# Patient Record
Sex: Female | Born: 1990 | Race: White | Hispanic: No | Marital: Single | Smoking: Current every day smoker
Health system: Southern US, Community
[De-identification: ages and names within clinical notes are randomized; demographics above are authoritative.]

## PROBLEM LIST (undated history)

## (undated) DIAGNOSIS — B009 Herpesviral infection, unspecified: Secondary | ICD-10-CM

## (undated) DIAGNOSIS — K219 Gastro-esophageal reflux disease without esophagitis: Secondary | ICD-10-CM

## (undated) DIAGNOSIS — F1121 Opioid dependence, in remission: Secondary | ICD-10-CM

## (undated) HISTORY — DX: Opioid dependence, in remission: F11.21

---
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
## 2008-02-21 ENCOUNTER — Emergency Department (HOSPITAL_COMMUNITY): Admission: EM | Admit: 2008-02-21 | Discharge: 2008-02-22 | Payer: Self-pay | Admitting: Emergency Medicine

## 2008-10-20 ENCOUNTER — Emergency Department (HOSPITAL_COMMUNITY): Admission: EM | Admit: 2008-10-20 | Discharge: 2008-10-20 | Payer: Self-pay | Admitting: Emergency Medicine

## 2009-10-19 ENCOUNTER — Emergency Department (HOSPITAL_COMMUNITY): Admission: EM | Admit: 2009-10-19 | Discharge: 2009-10-19 | Payer: Self-pay | Admitting: Emergency Medicine

## 2010-02-23 ENCOUNTER — Emergency Department (HOSPITAL_COMMUNITY): Admission: EM | Admit: 2010-02-23 | Discharge: 2010-02-23 | Payer: Self-pay | Admitting: Emergency Medicine

## 2010-07-10 ENCOUNTER — Emergency Department (HOSPITAL_COMMUNITY): Admission: EM | Admit: 2010-07-10 | Discharge: 2010-07-10 | Payer: Self-pay | Admitting: Emergency Medicine

## 2011-01-22 IMAGING — CR DG KNEE COMPLETE 4+V*R*
4 series · 4 of 4 positions shown · non-contrast
Comparison: None.

CLINICAL DATA: Knee pain

RIGHT KNEE - COMPLETE 4+ VIEW

[view not recorded (1 of 4)]
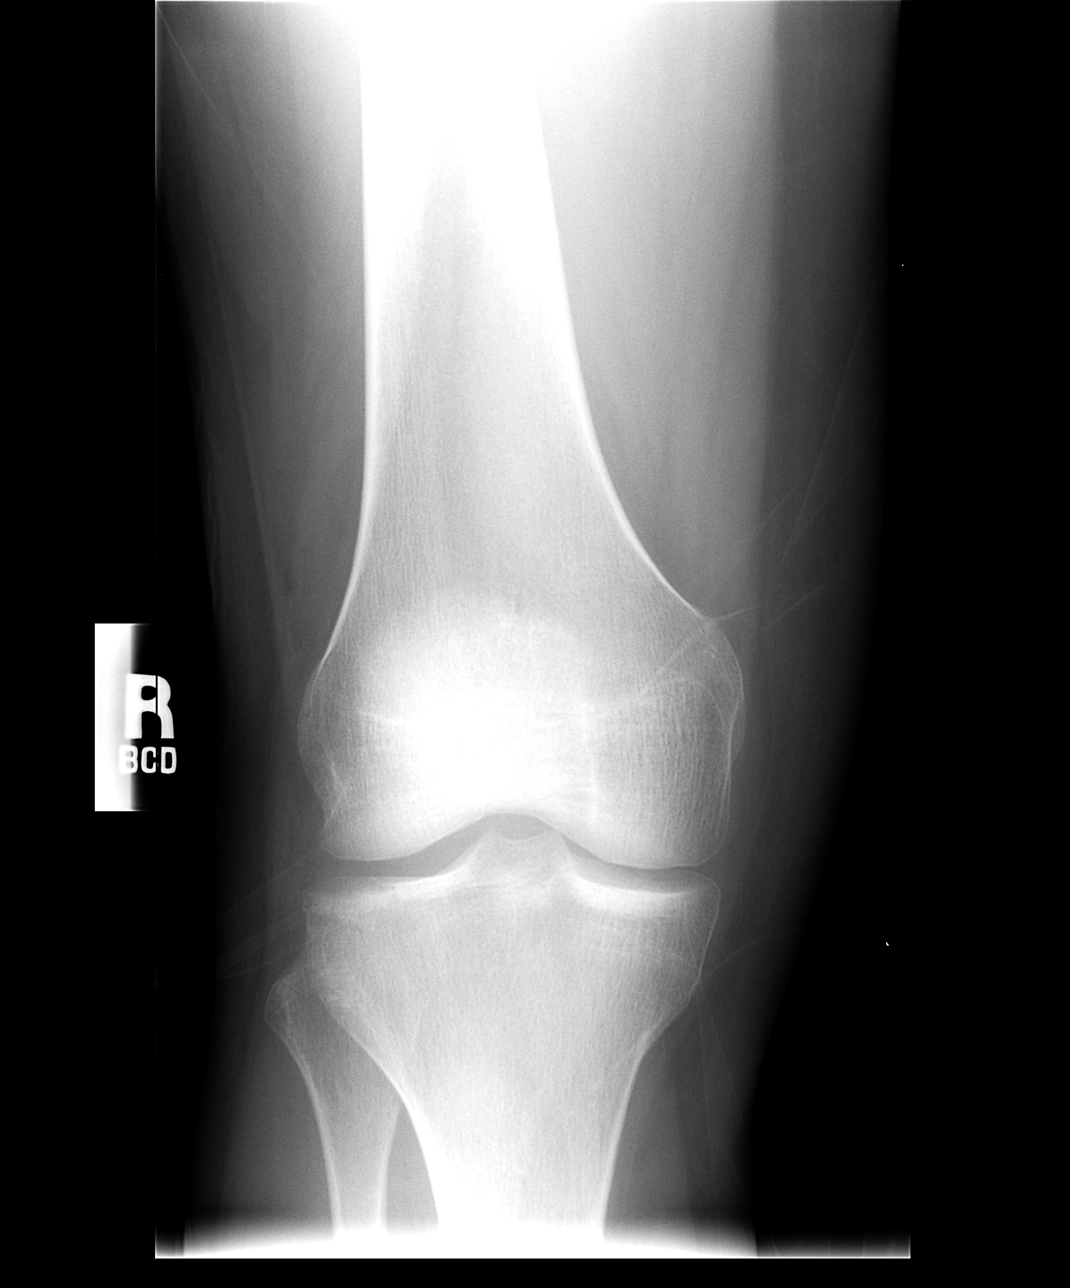

[view not recorded (2 of 4)]
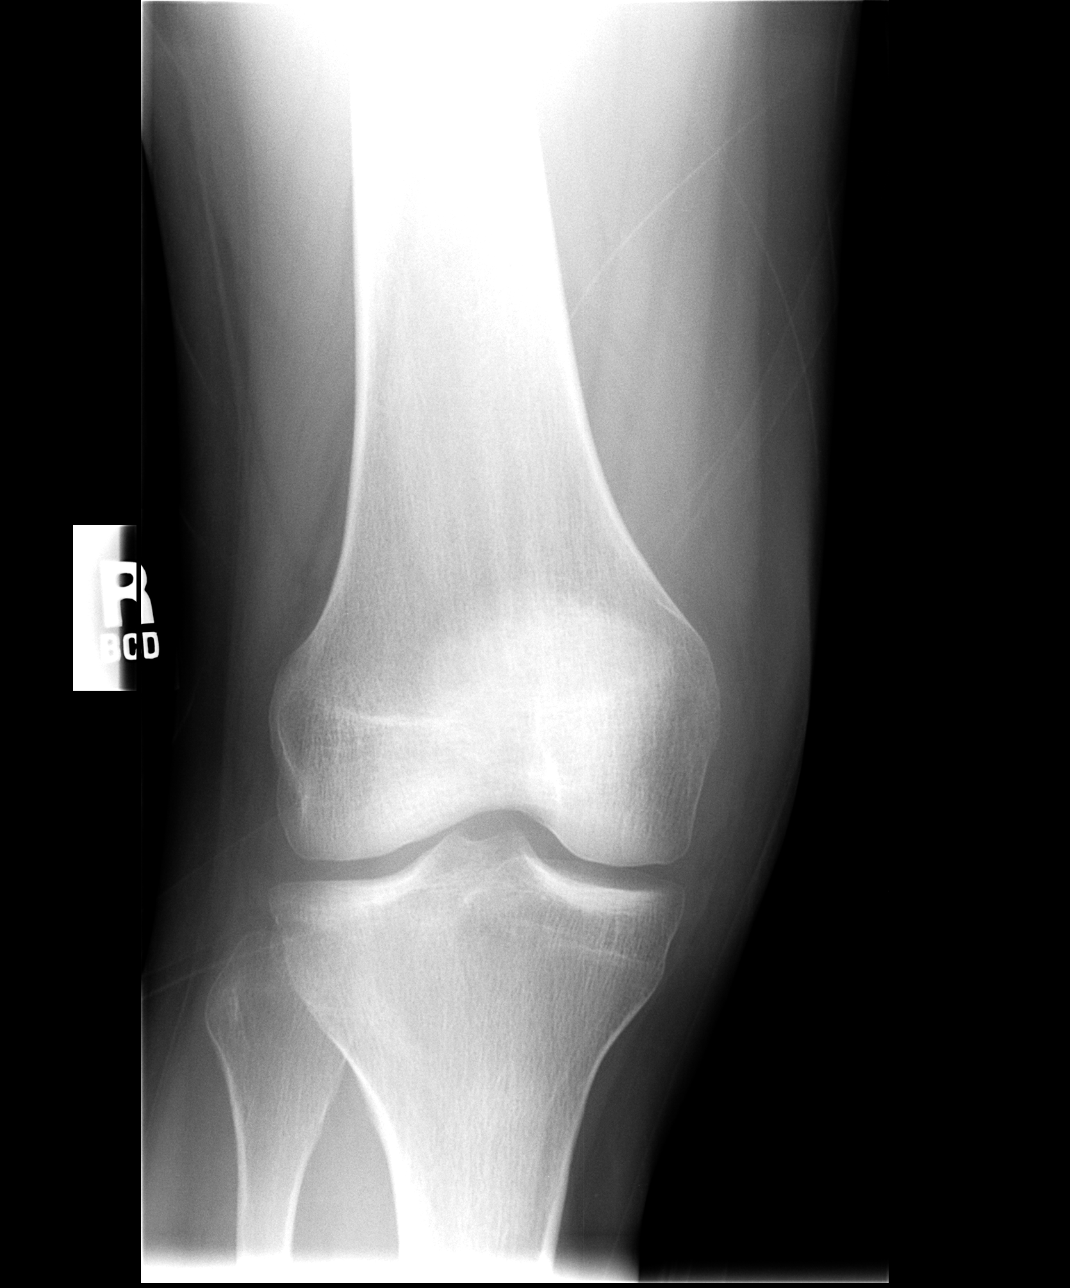

[view not recorded (3 of 4)]
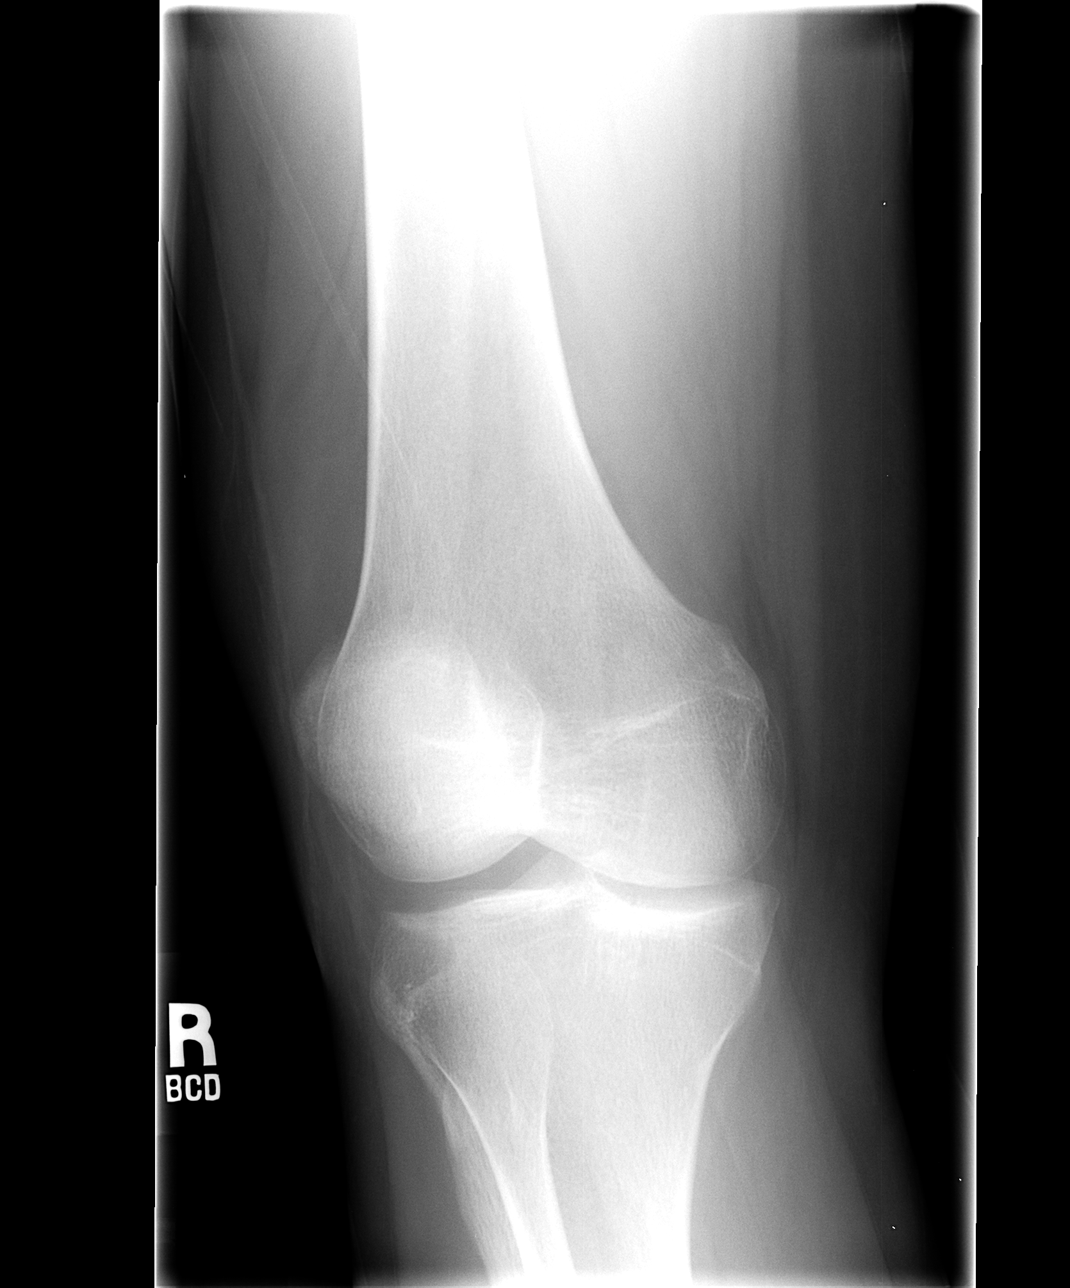

[view not recorded (4 of 4)]
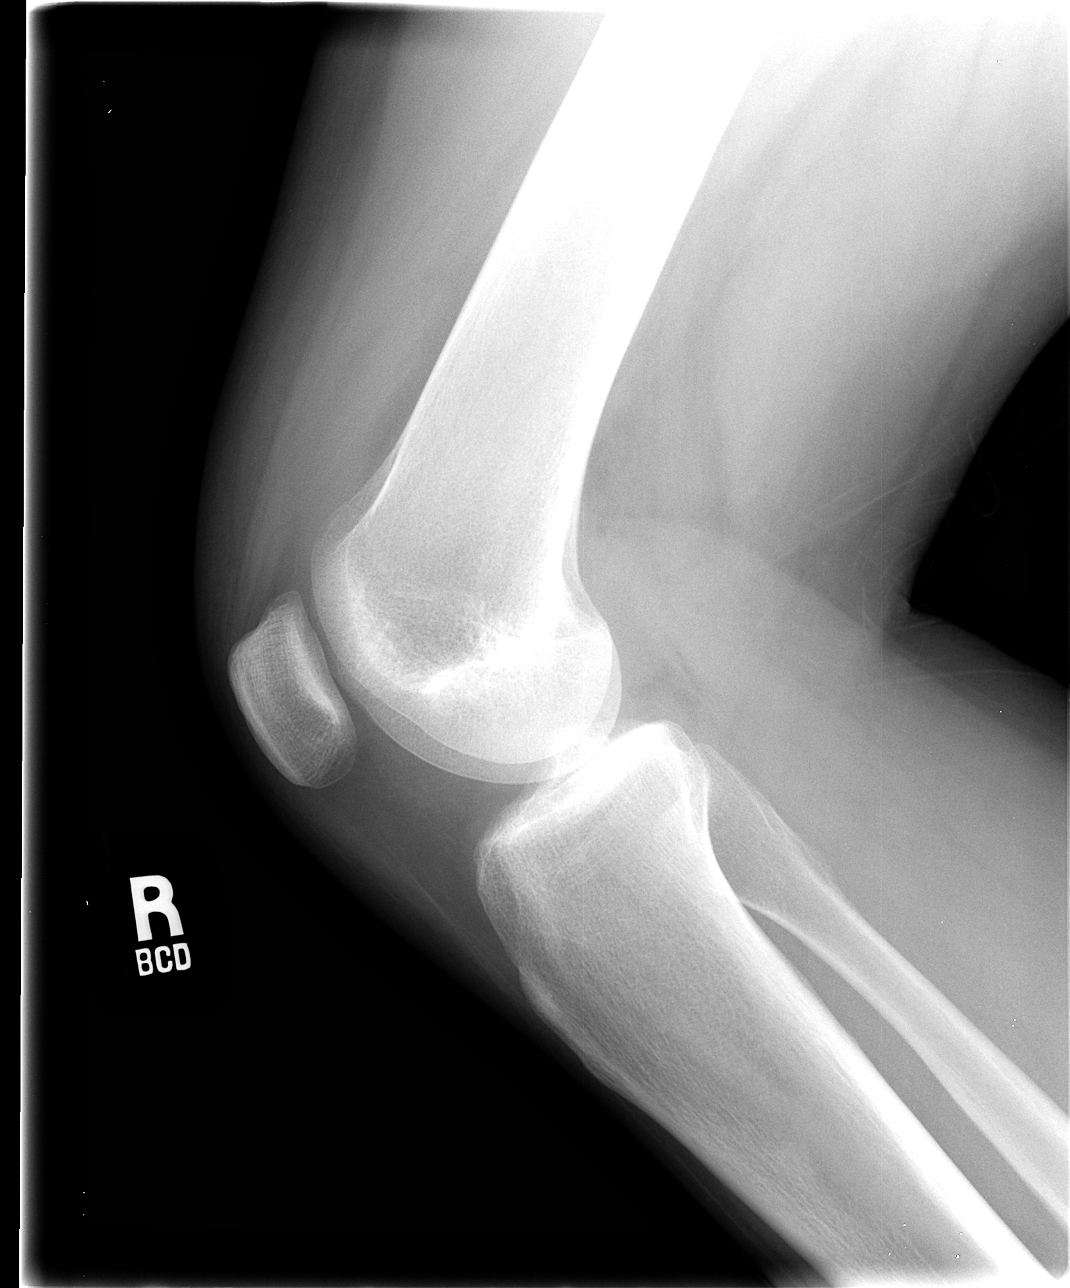

[4 of 4 positions shown; findings below may reference images not displayed]

FINDINGS: There is no evidence of fracture, dislocation, or joint
effusion.  There is no evidence of arthropathy or other focal bone
abnormality.  Soft tissues are unremarkable.
IMPRESSION: Negative.

## 2011-01-26 ENCOUNTER — Other Ambulatory Visit (HOSPITAL_COMMUNITY)
Admission: RE | Admit: 2011-01-26 | Discharge: 2011-01-26 | Disposition: A | Discharge status: Home / Self Care | Payer: Medicaid Other | Source: Ambulatory Visit | Attending: Obstetrics and Gynecology | Admitting: Obstetrics and Gynecology

## 2011-01-26 ENCOUNTER — Other Ambulatory Visit: Payer: Self-pay | Admitting: Adult Health

## 2011-01-26 DIAGNOSIS — Z01419 Encounter for gynecological examination (general) (routine) without abnormal findings: Secondary | ICD-10-CM | POA: Insufficient documentation

## 2011-01-26 DIAGNOSIS — Z113 Encounter for screening for infections with a predominantly sexual mode of transmission: Secondary | ICD-10-CM | POA: Insufficient documentation

## 2011-01-31 LAB — URINE MICROSCOPIC-ADD ON

## 2011-01-31 LAB — URINALYSIS, ROUTINE W REFLEX MICROSCOPIC
Bilirubin Urine: NEGATIVE
Glucose, UA: NEGATIVE mg/dL
Ketones, ur: NEGATIVE mg/dL
Nitrite: NEGATIVE
Urobilinogen, UA: 0.2 mg/dL (ref 0.0–1.0)

## 2011-01-31 LAB — PREGNANCY, URINE: Preg Test, Ur: NEGATIVE

## 2011-03-01 NOTE — H&P (Signed)
NAMELEVA, BAINE           ACCOUNT NO.:  [id number]   MEDICAL RECORD NO.:  [id number]          PATIENT TYPE:  INP   LOCATION:  NA                            FACILITY:  [facility]   PHYSICIAN:  Tilda Burrow, M.D. DATE OF BIRTH:  1991-05-23   DATE OF ADMISSION:  04/05/2007  DATE OF DISCHARGE:  LH                              HISTORY & PHYSICAL   ADMISSION DIAGNOSIS:  Pregnancy 39-1/[redacted] week gestation, cervical  favorability, elective induction of labor.   HISTORY OF PRESENT ILLNESS:  This 20 year old primiparous female was  admitted for elective induction of labor per patient insistence.  She  has cervical favorability at 2 cm  80%  -1, presenting part is vertex.  Cervical favorability is considered good.  She is being admitted for  induction of labor.   PAST MEDICAL HISTORY:  Benign.   PAST SURGICAL HISTORY:  Negative.   ALLERGIES:  None.   SOCIAL HISTORY:  Single, lives with mother and stepfather, works at  [employer], 12th grader in high school, recent graduate.   PRENATAL LABS:  Include blood type O+, __________ negative, rubella  immunity, present hemoglobin 12, hematocrit 35, hepatitis, HIV, RPR, GC  and chlamydia all negative.  Group B strep positive.  Hemoglobin 11,  hematocrit 36, glucose tolerance test 98 mg%.  Pecola Leisure is a female.  She is  planning circumcision.   PLAN:  Admit on Thursday for Foley bulb cervical ripening and Pitocin  induction in a.m.      Tilda Burrow, M.D.  Electronically Signed     JVF/MEDQ  D:  04/04/2007  T:  04/04/2007  Job:  [id number]   cc:   Fara Chute  Fax: [phone]

## 2011-03-01 NOTE — Group Therapy Note (Signed)
NAMEMarland Kitchen  LOMA, DUBUQUE           ACCOUNT NO.:  [id number]   MEDICAL RECORD NO.:  [id number]          PATIENT TYPE:  INP   LOCATION:  LDR3                          FACILITY:  [facility]   PHYSICIAN:  Tilda Burrow, M.D. DATE OF BIRTH:  1991-02-05   DATE OF PROCEDURE:  DATE OF DISCHARGE:                                 PROGRESS NOTE   We let Jmya rest for a little while and then she developed an urge to  push.  She pushed for not quite an hour and had a spontaneous vaginal  delivery of a viable female infant at 10:21.  The shoulders delivered very  quickly after delivery of the head.  Weight is 6 pounds 14 ounces,  Apgars are 9 and 9.  Twenty units of Pitocin diluted in 1000 cc of  lactated Ringer's was infused rapidly IV.  The placenta separated  spontaneously and delivered via controlled cord traction and maternal  pushing effort at 10:25.  Estimated blood loss 200 cc.  The vagina was  inspected and a second degree laceration was found.  It was infiltrated  with 10 cc of 1% Xylocaine and repaired with a 2-0 Vicryl.  The epidural  catheter was removed with the blue tip visualized as being intact.      Jacklyn Shell, C.N.M.      Tilda Burrow, M.D.  Electronically Signed    FC/MEDQ  D:  04/06/2007  T:  04/06/2007  Job:  [id number]

## 2011-03-01 NOTE — Op Note (Signed)
Brandy Farrell, Farrell           ACCOUNT NO.:  [id number]   MEDICAL RECORD NO.:  [id number]          PATIENT TYPE:  INP   LOCATION:  LDR3                          FACILITY:  [facility]   PHYSICIAN:  Tilda Burrow, M.D. DATE OF BIRTH:  10-Nov-1990   DATE OF PROCEDURE:  04/06/2007  DATE OF DISCHARGE:                               OPERATIVE REPORT   PROCEDURE:  Continuous epidural catheter placement at 5 a.m. on April 06, 2007.   INDICATIONS:  Patient requested at 6 cm dilated for epidural placement.  The patient had him the  Foley catheter inserted and developed labor on  her own in response to this Foley; and contracted actively through the  night requiring IV analgesics x2 and continuing to progress in labor.  Fetal heart rate tracing shows a normal pattern with good fetal heart  rate activity.  Cervix s6 cm at last nursing check.   DETAILS OF THE PROCEDURE:  The patient was placed in the sitting  position; flexed foreword with epidural placed using loss of resistance  technique.  After prepping and draping of the back, the L2-3 interspace  was attempted and bony obstruction encountered.  We moved down one  interspace and at the L3-4 interspace the epidural space was identified,  loss of resistant technique confirming proper location, and 3 mL of 1.5%  Xylocaine with epinephrine infused to dilate up the space.  Catheter was  then inserted; and there was a popping sensation followed by evidence  of intravenous location of the catheter tip.  Aspiration drew venous  blood, so a presumed intravenous position of the catheter tip was  assumed.   The catheter was removed; and we moved up one interspace, at a slightly  different angle; and were able to identify the epidural space using loss  of resistance technique.  The catheter was threaded without any fluid  distention of the epidural space; and the catheter tip was inserted 3  cm.  The catheter was taped to the back, a 7 mL bolus of 1%  Xylocaine  with epinephrine infused; and 14 mL per hour used to follow.  The  patient has good symmetric analgesic effect, and began to sleep through  the contractions.   ADDENDUM:  Shortly after our epidural was quite effective, at  approximately 5:30 a.m., the Foley catheter was inserted and 400 mL of  urine drained.  Shortly thereafter, the patient had a significant  bradycardia down into the 60s lasting greater than 60 seconds, with slow  recovery.  The exam showed the cervix to be 9 cm but at a -1 station.  Position changes and oxygen were administered with fluid bolus.  The  patient's blood pressure was acceptable in approximately the 100 to 110  diastolic range.  No ephedrine was necessary; and she had spontaneous  recovery of on the heart rate into the normal range with total  bradycardia time. between three and four minutes. Beat-to-beat  variability was present.  There was no suspicion of late decelerations.  It was assumed that the bradycardia was associated with vagal response  to the changes in head pressure  with the bladder having been emptied.   The patient is completely comfortable; and she is now at 9 cm 90%  effaced, -1 station.  The ischial spines are quite prominent.  It is yet  to be determined, the ability of the head to proceed past the spine as  the vertex is in a right occiput anterior position.  Will monitor for  progress.  Contractions are spaced out to every 8 minutes; will require  Pitocin augmentation to be initiated.      Tilda Burrow, M.D.  Electronically Signed     JVF/MEDQ  D:  04/06/2007  T:  04/06/2007  Job:  [id number]

## 2011-03-04 NOTE — H&P (Signed)
NAMERENE, GONSOULIN           ACCOUNT NO.:  [id number]   MEDICAL RECORD NO.:  [id number]          PATIENT TYPE:  OIB   LOCATION:  A415                          FACILITY:  [facility]   PHYSICIAN:  Tilda Burrow, M.D. DATE OF BIRTH:  09-11-1991   DATE OF ADMISSION:  11/08/2006  DATE OF DISCHARGE:  LH                              HISTORY & PHYSICAL   REASON FOR ADMISSION:  Pregnancy at 20 weeks with urinary frequency,  urinary discomfort, mild abdominal cramping, and hematuria.   HISTORY OF PRESENT ILLNESS:  Brandy Farrell presents to the hospital with these  complaints.  Medical history unknown at this point due to the fact that  prenatal records are unavailable.  Electronic fetal monitoring was  applied to the patient.  There were no uterine contractions noted.  Fetal heart rate is strong and regular.  She had been seen in the  diagnosis and was diagnosed with a UTI but apparently was negligent in  taking her medication.   PLAN:  We are going to give her 1 gm of Rocephin IM, discharge her 30-40  minutes after that, with a prescription for Macrobid b.i.d. for 7 days  and Pyridium 200 mg t.i.d. for 3 days.  She is to let us know if there  are any further problems.      Brandy Farrell, Brandy Farrell      Tilda Burrow, M.D.  Electronically Signed    DL/MEDQ  D:  16/07/9603  T:  11/08/2006  Job:  [id number]

## 2011-06-05 ENCOUNTER — Encounter: Payer: Self-pay | Admitting: *Deleted

## 2011-06-05 ENCOUNTER — Emergency Department (HOSPITAL_COMMUNITY)
Admission: EM | Admit: 2011-06-05 | Discharge: 2011-06-05 | Disposition: A | Discharge status: Home / Self Care | Payer: Self-pay | Attending: Emergency Medicine | Admitting: Emergency Medicine

## 2011-06-05 DIAGNOSIS — R071 Chest pain on breathing: Secondary | ICD-10-CM | POA: Insufficient documentation

## 2011-06-05 DIAGNOSIS — R0789 Other chest pain: Secondary | ICD-10-CM

## 2011-06-05 DIAGNOSIS — F17200 Nicotine dependence, unspecified, uncomplicated: Secondary | ICD-10-CM | POA: Insufficient documentation

## 2011-06-05 LAB — URINALYSIS, ROUTINE W REFLEX MICROSCOPIC
Bilirubin Urine: NEGATIVE
Nitrite: NEGATIVE
Protein, ur: NEGATIVE mg/dL

## 2011-06-05 LAB — CBC
HCT: 36.6 % (ref 36.0–46.0)
Hemoglobin: 12.6 g/dL (ref 12.0–15.0)
MCH: 32.5 pg (ref 26.0–34.0)
Platelets: 325 10*3/uL (ref 150–400)
RBC: 3.88 MIL/uL (ref 3.87–5.11)

## 2011-06-05 LAB — DIFFERENTIAL
Basophils Absolute: 0.1 10*3/uL (ref 0.0–0.1)
Eosinophils Relative: 1 % (ref 0–5)
Lymphs Abs: 1.5 10*3/uL (ref 0.7–4.0)
Monocytes Absolute: 1.6 10*3/uL — ABNORMAL HIGH (ref 0.1–1.0)
Monocytes Relative: 8 % (ref 3–12)
Neutro Abs: 16.1 10*3/uL — ABNORMAL HIGH (ref 1.7–7.7)
Neutrophils Relative %: 84 % — ABNORMAL HIGH (ref 43–77)

## 2011-06-05 LAB — BASIC METABOLIC PANEL
Calcium: 9.3 mg/dL (ref 8.4–10.5)
Chloride: 98 mEq/L (ref 96–112)
GFR calc Af Amer: >60 mL/min (ref >60)
GFR calc non Af Amer: >60 mL/min (ref >60)

## 2011-06-05 LAB — URINE MICROSCOPIC-ADD ON

## 2011-06-05 LAB — PREGNANCY, URINE: Preg Test, Ur: NEGATIVE

## 2011-06-05 MED ORDER — IBUPROFEN 600 MG PO TABS: 600.0000 mg | ORAL_TABLET | Freq: Four times a day (QID) | ORAL | Status: AC | PRN

## 2011-06-05 MED ORDER — SODIUM CHLORIDE 0.9 % IV SOLN: Freq: Once | INTRAVENOUS | Status: DC

## 2011-06-05 MED ORDER — HYDROCODONE-ACETAMINOPHEN 5-325 MG PO TABS
1.0000 | ORAL_TABLET | Freq: Once | ORAL | Status: AC
  Administered 2011-06-05: 1 via ORAL
  Filled 2011-06-05: qty 1

## 2011-06-05 MED ORDER — HYDROCODONE-ACETAMINOPHEN 5-325 MG PO TABS: 1.0000 | ORAL_TABLET | ORAL | Status: AC | PRN

## 2011-06-05 MED ORDER — IBUPROFEN 800 MG PO TABS
800.0000 mg | ORAL_TABLET | Freq: Once | ORAL | Status: AC
  Administered 2011-06-05: 800 mg via ORAL
  Filled 2011-06-05: qty 1

## 2011-06-05 NOTE — ED Provider Notes (Signed)
History     CSN: [id number] Arrival date & time: 06/05/2011  1:58 AM  Chief Complaint  Patient presents with  . Abdominal Pain    right upper quad   Patient is a 20 y.o. female presenting with chest pain. The history is provided by the patient.  Chest Pain The chest pain began yesterday. Chest pain occurs constantly. The chest pain is worsening. The pain is associated with breathing, coughing and lifting. At its most intense, the pain is at 7/10. The pain is currently at 5/10. The severity of the pain is moderate. The quality of the pain is described as aching. The pain does not radiate. Chest pain is worsened by certain positions and deep breathing. Pertinent negatives for primary symptoms include no fever, no cough, no wheezing, no nausea and no vomiting.     History reviewed. No pertinent past medical history.  History reviewed. No pertinent past surgical history.  History reviewed. No pertinent family history.  History  Substance Use Topics  . Smoking status: Current Some Day Smoker  . Smokeless tobacco: Not on file  . Alcohol Use: Yes     occasionally    OB History    Grav Para Term Preterm Abortions TAB SAB Ect Mult Living                  Review of Systems  Constitutional: Negative for fever.  Respiratory: Negative for cough and wheezing.   Cardiovascular: Positive for chest pain.  Gastrointestinal: Negative for nausea and vomiting.    Physical Exam  BP 124/59  Pulse 100  Temp 99 F (37.2 C)  Resp 20  Ht 5\' 4"  (1.626 m)  Wt 170 lb (77.111 kg)  BMI 29.18 kg/m2  SpO2 100%  Physical Exam  ED Course  Procedures  MDM Patient with pain to right chest wall and no recall of any strenuous activity. Improved with ibuprofen and hydrocodone.  MDM Reviewed: nursing note and vitals         Nicoletta Dress. Colon Branch, MD 06/05/11 [phone]

## 2011-06-05 NOTE — ED Notes (Signed)
Pt c/o right upper quad pain x 1 day; pt denies any n/v/d

## 2011-06-05 NOTE — ED Notes (Signed)
Pt self ambulated out with a steady gait stating no needs 

## 2011-07-07 LAB — DIFFERENTIAL
Basophils Absolute: 0
Basophils Relative: 0
Monocytes Relative: 8
Neutrophils Relative %: 86 — ABNORMAL HIGH

## 2011-07-07 LAB — CBC
HCT: 35.8 — ABNORMAL LOW
Hemoglobin: 12.2
MCHC: 34.1
MCV: 90.5
RBC: 3.96
RDW: 12.4

## 2011-07-07 LAB — URINE MICROSCOPIC-ADD ON

## 2011-07-07 LAB — URINALYSIS, ROUTINE W REFLEX MICROSCOPIC
Glucose, UA: NEGATIVE
Protein, ur: 30 — AB
Specific Gravity, Urine: 1.01
Urobilinogen, UA: 1
pH: 6.5

## 2011-07-29 LAB — STREP A DNA PROBE: Group A Strep Probe: NEGATIVE

## 2011-08-03 LAB — HEMOGLOBIN AND HEMATOCRIT, BLOOD
HCT: 29.2 — ABNORMAL LOW
Hemoglobin: 10.3 — ABNORMAL LOW

## 2011-08-03 LAB — CORD BLOOD GAS (ARTERIAL)
Bicarbonate: 23.7
pCO2 cord blood (arterial): 47.5
pH cord blood (arterial): 7.31
pO2 cord blood: 23.5

## 2011-08-03 LAB — DIFFERENTIAL
Basophils Absolute: 0.1
Lymphs Abs: 1.7
Monocytes Absolute: 0.8
Monocytes Relative: 7

## 2011-08-03 LAB — CBC
HCT: 33.8 — ABNORMAL LOW
Hemoglobin: 12
MCHC: 35.4
RBC: 3.59 — ABNORMAL LOW
WBC: 10.6 — ABNORMAL HIGH

## 2011-08-03 LAB — ABO/RH: ABO/RH(D): O POS

## 2012-09-18 ENCOUNTER — Other Ambulatory Visit: Payer: Self-pay | Admitting: Obstetrics & Gynecology

## 2013-12-11 ENCOUNTER — Ambulatory Visit: Payer: Self-pay | Admitting: Advanced Practice Midwife

## 2014-07-24 ENCOUNTER — Other Ambulatory Visit: Payer: Self-pay | Admitting: Advanced Practice Midwife

## 2014-07-28 ENCOUNTER — Other Ambulatory Visit: Payer: Self-pay | Admitting: Women's Health

## 2014-07-29 ENCOUNTER — Encounter: Payer: Self-pay | Admitting: Women's Health

## 2014-07-29 ENCOUNTER — Ambulatory Visit (INDEPENDENT_AMBULATORY_CARE_PROVIDER_SITE_OTHER): Payer: Medicaid Other | Admitting: Women's Health

## 2014-07-29 ENCOUNTER — Other Ambulatory Visit (HOSPITAL_COMMUNITY)
Admission: RE | Admit: 2014-07-29 | Discharge: 2014-07-29 | Disposition: A | Discharge status: Home / Self Care | Payer: Medicaid Other | Source: Ambulatory Visit | Attending: Obstetrics & Gynecology | Admitting: Obstetrics & Gynecology

## 2014-07-29 VITALS — BP 128/88 | Ht 65.5 in | Wt 190.0 lb

## 2014-07-29 DIAGNOSIS — Z308 Encounter for other contraceptive management: Secondary | ICD-10-CM

## 2014-07-29 DIAGNOSIS — Z113 Encounter for screening for infections with a predominantly sexual mode of transmission: Secondary | ICD-10-CM | POA: Diagnosis present

## 2014-07-29 DIAGNOSIS — Z01419 Encounter for gynecological examination (general) (routine) without abnormal findings: Secondary | ICD-10-CM | POA: Diagnosis not present

## 2014-07-29 DIAGNOSIS — Z3040 Encounter for surveillance of contraceptives, unspecified: Secondary | ICD-10-CM

## 2014-07-29 MED ORDER — MISOPROSTOL 200 MCG PO TABS: 400.0000 ug | ORAL_TABLET | Freq: Once | ORAL | Status: DC

## 2014-07-29 NOTE — Patient Instructions (Signed)
Take cytotec 2-3 hours before you come to have your IUD removed

## 2014-07-29 NOTE — Progress Notes (Signed)
Patient ID: Brandy Farrell, female   DOB: 06/22/1991, 23 y.o.   MRN: [id number] Subjective:   Brandy Farrell is a 23 y.o. [id number] African American female here for a routine FP Mcaid well-woman exam.  No LMP recorded. Patient is not currently having periods (Reason: IUD).   Mirena past due to come out, should have been removed Nov 2014. IUD strings not visible when last checked, had pelvic u/s that confirmed still intrauterine- thinks this may have been in 2012.  Last sex last Thursday, no condoms.  Current complaints: none PCP: Dr. Andrey FarmerSassar, Brandy Farrell       Does desire STI labs  Social History: Sexual: heterosexual Marital Status: dating Living situation: alone w/ child Occupation: Production designer, theatre/television/filmManager at [employer] Tobacco/alcohol: tobacco & etoh occasionally  Illicit drugs: no history of illicit drug use  The following portions of the patient's history were reviewed and updated as appropriate: allergies, current medications, past family history, past medical history, past social history, past surgical history and problem list.  Past Medical History History reviewed. No pertinent past medical history.  Past Surgical History History reviewed. No pertinent past surgical history.  Gynecologic History No obstetric history on file.  No LMP recorded. Patient is not currently having periods (Reason: IUD). Contraception: IUD Last Pap: not sure. Results were: unsure, has had some abnormal paps in past Last mammogram: never. Results were: n/a Last TCS: never  Obstetric History OB History  No data available    Current Medications No current outpatient prescriptions on file prior to visit.   No current facility-administered medications on file prior to visit.    Review of Systems Patient denies any headaches, blurred vision, shortness of breath, chest pain, abdominal pain, problems with bowel movements, urination, or intercourse.  Objective:  BP 128/88  Ht 5' 5.5" (1.664 m)  Wt 190 lb (86.183  kg)  BMI 31.13 kg/m2 Physical Exam  General:  Well developed, well nourished, no acute distress. She is alert and oriented x3. Skin:  Warm and dry Neck:  Midline trachea, no thyromegaly or nodules Cardiovascular: Regular rate and rhythm, no murmur heard Lungs:  Effort normal, all lung fields clear to auscultation bilaterally Breasts:  No dominant palpable mass, retraction, or nipple discharge Abdomen:  Soft, non tender, no hepatosplenomegaly or masses Pelvic:  External genitalia is normal in appearance.  The vagina is normal in appearance. The cervix is bulbous, no CMT.  IUD strings not visible. Thin prep pap is done w/ reflex HR HPV cotesting. Uterus is felt to be normal size, shape, and contour.  No adnexal masses or tenderness noted. Extremities:  No swelling or varicosities noted Psych:  She has a normal mood and affect UPT today: neg  Assessment:   Healthy well-woman exam STI screening  IUD past due to be removed  Plan:  Rx cytotec 200mcg to take 2-3hrs before coming to have IUD removed F/U 1wk for IUD removal and reinsertion, or sooner if needed Abstinence until after new IUD placed Mammogram @25yo  or sooner if problems Colonoscopy @25yo  or sooner if problems  Marge DuncansBooker, Kimberly Randall CNM, [facility] 07/29/2014 12:41 PM

## 2014-07-30 ENCOUNTER — Telehealth: Payer: Self-pay | Admitting: Women's Health

## 2014-07-30 ENCOUNTER — Encounter: Payer: Self-pay | Admitting: Women's Health

## 2014-07-30 DIAGNOSIS — R768 Other specified abnormal immunological findings in serum: Secondary | ICD-10-CM | POA: Insufficient documentation

## 2014-07-30 LAB — HIV ANTIBODY (ROUTINE TESTING W REFLEX): HIV 1&2 Ab, 4th Generation: NONREACTIVE

## 2014-07-30 LAB — HSV 2 ANTIBODY, IGG: HSV 2 GLYCOPROTEIN G AB, IGG: 9.4 IV — AB

## 2014-07-30 LAB — RPR

## 2014-07-30 LAB — CYTOLOGY - PAP

## 2014-07-30 NOTE — Telephone Encounter (Signed)
Attempted to call pt to notify of +HSV2, left message to return call.  Cheral MarkerKimberly R. Latravia Southgate, CNM, WHNP-BC 07/30/2014 2:12 PM

## 2014-07-30 NOTE — Telephone Encounter (Signed)
Pt returned call, notified her of +HSV2, answered questions.  Cheral MarkerKimberly R. Adely Facer, CNM, [facility] 07/30/2014 2:57 PM

## 2014-08-05 ENCOUNTER — Ambulatory Visit: Payer: Medicaid Other | Admitting: Women's Health

## 2014-08-06 ENCOUNTER — Ambulatory Visit (INDEPENDENT_AMBULATORY_CARE_PROVIDER_SITE_OTHER): Payer: Medicaid Other | Admitting: Women's Health

## 2014-08-06 ENCOUNTER — Encounter: Payer: Self-pay | Admitting: Women's Health

## 2014-08-06 VITALS — BP 132/58 | Ht 65.0 in | Wt 193.0 lb

## 2014-08-06 DIAGNOSIS — Z3202 Encounter for pregnancy test, result negative: Secondary | ICD-10-CM

## 2014-08-06 DIAGNOSIS — Z975 Presence of (intrauterine) contraceptive device: Secondary | ICD-10-CM

## 2014-08-06 DIAGNOSIS — Z538 Procedure and treatment not carried out for other reasons: Secondary | ICD-10-CM

## 2014-08-06 LAB — POCT URINE PREGNANCY: PREG TEST UR: NEGATIVE

## 2014-08-06 MED ORDER — DOXYCYCLINE HYCLATE 100 MG PO CAPS: 100.0000 mg | ORAL_CAPSULE | Freq: Two times a day (BID) | ORAL | Status: DC

## 2014-08-06 NOTE — Progress Notes (Signed)
Christain Seward GraterM Brumley is a 23 y.o. year old [id number] African American female who presents for removal of a Mirena IUD. Her Mirena IUD was placed Nov 2009 and she would like another. She forgot to take cytotec as directed.   No LMP recorded. Patient is not currently having periods (Reason: IUD). BP 132/58  Ht 5\' 5"  (1.651 m)  Wt 193 lb (87.544 kg)  BMI 32.12 kg/m2  Time out was performed.  A graves speculum was placed in the vagina.  The cervix was visualized, and the strings were not visible. Transvaginal u/s performed and IUD confirmed w/in mid uterus by JAG. Attempted removal w/ cyto brush and curved hemostats w/o success. Cervix dilated up to 22, and still unable to grasp w/ curved hemostats. Dr. Emelda FearFerguson in to assist, crochet hook, and IUD remover also tried w/o success. Decision made by Dr. Emelda FearFerguson to have her return one day next week in am for laminaria placement, then pm for paracervical block and removal attempt. Will treat w/ Doxycycline 100mg  BID x 1d d/t intrauterine instrumentation. No sex until after IUD removed and new one placed.   Marge DuncansBooker, Aariv Medlock Randall CNM, [facility]NP[facility] 08/06/2014 5:26 PM

## 2014-08-13 ENCOUNTER — Ambulatory Visit (INDEPENDENT_AMBULATORY_CARE_PROVIDER_SITE_OTHER): Payer: Medicaid Other | Admitting: Women's Health

## 2014-08-13 ENCOUNTER — Encounter: Payer: Self-pay | Admitting: Obstetrics and Gynecology

## 2014-08-13 ENCOUNTER — Encounter: Payer: Self-pay | Admitting: Women's Health

## 2014-08-13 ENCOUNTER — Ambulatory Visit (INDEPENDENT_AMBULATORY_CARE_PROVIDER_SITE_OTHER): Payer: Medicaid Other | Admitting: Obstetrics and Gynecology

## 2014-08-13 VITALS — BP 108/60 | Ht 65.0 in | Wt 193.0 lb

## 2014-08-13 VITALS — BP 110/70 | Ht 65.0 in | Wt 193.0 lb

## 2014-08-13 DIAGNOSIS — Z30433 Encounter for removal and reinsertion of intrauterine contraceptive device: Secondary | ICD-10-CM

## 2014-08-13 DIAGNOSIS — Z308 Encounter for other contraceptive management: Secondary | ICD-10-CM

## 2014-08-13 MED ORDER — DOXYCYCLINE HYCLATE 100 MG PO CAPS: 100.0000 mg | ORAL_CAPSULE | Freq: Two times a day (BID) | ORAL | Status: DC

## 2014-08-13 NOTE — Progress Notes (Signed)
Patient ID: Brandy Farrell, female   DOB: 11/30/1990, 23 y.o.   MRN: [id number]   [facility]  Patient name: Brandy Farrell MRN [id number]  Date of birth: 02/07/1991  CC & HPI:  Brandy Farrell is a 23 y.o. African American female presenting today for laminaria placement in preparation for IUD removal this afternoon.   Pertinent History Reviewed:  Medical & Surgical Hx:   History reviewed. No pertinent past medical history. History reviewed. No pertinent past surgical history. Medications: Reviewed & Updated - see associated section Social History: Reviewed -  reports that she has been smoking.  She has never used smokeless tobacco.  Objective Findings:  Vitals: BP 108/60  Ht 5\' 5"  (1.651 m)  Wt 193 lb (87.544 kg)  BMI 32.12 kg/m2  Physical Examination: General appearance - alert, well appearing, and in no distress Pelvic - normal external genitalia, vulva, vagina, cervix, uterus and adnexa Laminaria placed w/o difficulty w/ assistance from JVF, 2 damp 4x4's inserted into vagina  No results found for this or any previous visit (from the past 24 hour(s)).   Assessment & Plan:  A:   Laminaria placement in preparation for IUD removal this afternoon P:   F/U @4pm  as scheduled for laminaria removal and IUD removal and possible reinsertion   Marge DuncansBooker, Brandy Farrell CNM, WHNP-BC 08/13/2014 9:29 AM

## 2014-08-13 NOTE — Patient Instructions (Addendum)
 Nothing in vagina for 3 days (no sex, douching, tampons, etc...)  Check your strings once a month to make sure you can feel them, if you are not able to please let us know  If you develop a fever of 100.4 or more in the next few weeks, or if you develop severe abdominal pain, please let us know  Use a backup method of birth control, such as condoms, for 2 weeks    Levonorgestrel intrauterine device (IUD)- Liletta What is this medicine? LEVONORGESTREL IUD (LEE voe nor jes trel) is a contraceptive (birth control) device. The device is placed inside the uterus by a healthcare professional. It is used to prevent pregnancy and can also be used to treat heavy bleeding that occurs during your period. Depending on the device, it can be used for 3 to 5 years. This medicine may be used for other purposes; ask your health care provider or pharmacist if you have questions. COMMON BRAND NAME(S): LILETTA, Mirena, Skyla What should I tell my health care provider before I take this medicine? They need to know if you have any of these conditions: -abnormal Pap smear -cancer of the breast, uterus, or cervix -diabetes -endometritis -genital or pelvic infection now or in the past -have more than one sexual partner or your partner has more than one partner -heart disease -history of an ectopic or tubal pregnancy -immune system problems -IUD in place -liver disease or tumor -problems with blood clots or take blood-thinners -use intravenous drugs -uterus of unusual shape -vaginal bleeding that has not been explained -an unusual or allergic reaction to levonorgestrel, other hormones, silicone, or polyethylene, medicines, foods, dyes, or preservatives -pregnant or trying to get pregnant -breast-feeding How should I use this medicine? This device is placed inside the uterus by a health care professional. Talk to your pediatrician regarding the use of this medicine in children. Special care may be  needed. Overdosage: If you think you have taken too much of this medicine contact a poison control center or emergency room at once. NOTE: This medicine is only for you. Do not share this medicine with others. What if I miss a dose? This does not apply. What may interact with this medicine? Do not take this medicine with any of the following medications: -amprenavir -bosentan -fosamprenavir This medicine may also interact with the following medications: -aprepitant -barbiturate medicines for inducing sleep or treating seizures -bexarotene -griseofulvin -medicines to treat seizures like carbamazepine, ethotoin, felbamate, oxcarbazepine, phenytoin, topiramate -modafinil -pioglitazone -rifabutin -rifampin -rifapentine -some medicines to treat HIV infection like atazanavir, indinavir, lopinavir, nelfinavir, tipranavir, ritonavir -St. Rasheen Schewe's wort -warfarin This list may not describe all possible interactions. Give your health care provider a list of all the medicines, herbs, non-prescription drugs, or dietary supplements you use. Also tell them if you smoke, drink alcohol, or use illegal drugs. Some items may interact with your medicine. What should I watch for while using this medicine? Visit your doctor or health care professional for regular check ups. See your doctor if you or your partner has sexual contact with others, becomes HIV positive, or gets a sexual transmitted disease. This product does not protect you against HIV infection (AIDS) or other sexually transmitted diseases. You can check the placement of the IUD yourself by reaching up to the top of your vagina with clean fingers to feel the threads. Do not pull on the threads. It is a good habit to check placement after each menstrual period. Call your doctor right away if   you feel more of the IUD than just the threads or if you cannot feel the threads at all. The IUD may come out by itself. You may become pregnant if the device  comes out. If you notice that the IUD has come out use a backup birth control method like condoms and call your health care provider. Using tampons will not change the position of the IUD and are okay to use during your period. What side effects may I notice from receiving this medicine? Side effects that you should report to your doctor or health care professional as soon as possible: -allergic reactions like skin rash, itching or hives, swelling of the face, lips, or tongue -fever, flu-like symptoms -genital sores -high blood pressure -no menstrual period for 6 weeks during use -pain, swelling, warmth in the leg -pelvic pain or tenderness -severe or sudden headache -signs of pregnancy -stomach cramping -sudden shortness of breath -trouble with balance, talking, or walking -unusual vaginal bleeding, discharge -yellowing of the eyes or skin Side effects that usually do not require medical attention (report to your doctor or health care professional if they continue or are bothersome): -acne -breast pain -change in sex drive or performance -changes in weight -cramping, dizziness, or faintness while the device is being inserted -headache -irregular menstrual bleeding within first 3 to 6 months of use -nausea This list may not describe all possible side effects. Call your doctor for medical advice about side effects. You may report side effects to FDA at 1-800-FDA-1088. Where should I keep my medicine? This does not apply. NOTE: This sheet is a summary. It may not cover all possible information. If you have questions about this medicine, talk to your doctor, pharmacist, or health care provider.  2015, Elsevier/Gold Standard. (2011-11-03 13:54:04)  

## 2014-08-13 NOTE — Progress Notes (Signed)
Patient ID: Brandy Farrell, female   DOB: 10/10/1991, 23 y.o.   MRN: [id number]  Brandy Farrell is a 23 y.o. No obstetric history on file. here for  IUD removal and Liletta IUD reinsertion. No GYN concerns.  Last pap smear was normal 07/29/14  IUD Removal and Reinsertion  Patient identified, informed consent performed. Discussed risks of irregular bleeding, cramping, infection, malpositioning or misplacement of the IUD outside the uterus which may require further procedures. Time out was performed. Speculum placed in the vagina. Hulda HumphreyLamiaria was removed Cervix visualized. Cleaned with Betadine x 2. Grasped anteriorly with a single tooth tenaculum after local anesthesia injected in anterior cervix and at 3,5 ,7 and 9 oclock with a total of 18 cc lidocaine. The strings of the IUD were grasped and pulled using ring forceps. The IUD was successfully removed in its entirety. Uterus sounded to 7 cm.  Liletta IUD placed per manufacturer's recommendations by Joellyn HaffKim Booker. Strings trimmed to 2-3 cm. Tenaculum was removed, good hemostasis noted. Patient tolerated procedure well. Patient was given post-procedure instructions.  Patient was also asked to check IUD strings periodically and offered follow up in 4 weeks for IUD check.  This chart was scribed for Tilda BurrowJohn Cadell Gabrielson V, MD by Chestine SporeSoijett Blue, ED Scribe. The patient was seen in room 2 at 4:25 PM.

## 2014-09-09 ENCOUNTER — Ambulatory Visit: Payer: Medicaid Other | Admitting: Adult Health

## 2014-09-18 ENCOUNTER — Ambulatory Visit: Payer: Medicaid Other | Admitting: Adult Health

## 2014-09-18 ENCOUNTER — Encounter: Payer: Self-pay | Admitting: Adult Health

## 2015-05-06 ENCOUNTER — Encounter: Payer: Self-pay | Admitting: Adult Health

## 2015-05-06 ENCOUNTER — Ambulatory Visit: Payer: Medicaid Other | Admitting: Adult Health

## 2018-01-09 ENCOUNTER — Ambulatory Visit (INDEPENDENT_AMBULATORY_CARE_PROVIDER_SITE_OTHER): Payer: Medicaid Other | Admitting: Advanced Practice Midwife

## 2018-01-09 ENCOUNTER — Encounter: Payer: Self-pay | Admitting: Advanced Practice Midwife

## 2018-01-09 ENCOUNTER — Other Ambulatory Visit: Payer: Self-pay

## 2018-01-09 ENCOUNTER — Other Ambulatory Visit (HOSPITAL_COMMUNITY)
Admission: RE | Admit: 2018-01-09 | Discharge: 2018-01-09 | Disposition: A | Discharge status: Home / Self Care | Payer: Medicaid Other | Source: Ambulatory Visit | Attending: Advanced Practice Midwife | Admitting: Advanced Practice Midwife

## 2018-01-09 VITALS — BP 118/72 | HR 87 | Ht 67.0 in | Wt 172.0 lb

## 2018-01-09 DIAGNOSIS — Z01419 Encounter for gynecological examination (general) (routine) without abnormal findings: Secondary | ICD-10-CM

## 2018-01-09 DIAGNOSIS — R1031 Right lower quadrant pain: Secondary | ICD-10-CM | POA: Diagnosis not present

## 2018-01-09 DIAGNOSIS — Z0001 Encounter for general adult medical examination with abnormal findings: Secondary | ICD-10-CM | POA: Diagnosis not present

## 2018-01-09 DIAGNOSIS — Z124 Encounter for screening for malignant neoplasm of cervix: Secondary | ICD-10-CM | POA: Insufficient documentation

## 2018-01-09 DIAGNOSIS — Z113 Encounter for screening for infections with a predominantly sexual mode of transmission: Secondary | ICD-10-CM

## 2018-01-09 NOTE — Progress Notes (Signed)
Brandy Farrell 27 y.o.  Vitals:   01/09/18 1518  BP: 118/72  Pulse: 87     Filed Weights   01/09/18 1518  Weight: 172 lb (78 kg)    Past Medical History: History reviewed. No pertinent past medical history.  Past Surgical History: History reviewed. No pertinent surgical history.  Family History: Family History  Problem Relation Age of Onset  . Hypertension Mother   . Cancer Maternal Grandmother        colon  . Cancer Other        PGGM    Social History: Social History   Tobacco Use  . Smoking status: Current Some Day Smoker    Packs/day: 1.00    Types: Cigarettes  . Smokeless tobacco: Never Used  Substance Use Topics  . Alcohol use: Yes    Comment: occasionally  . Drug use: No    Allergies: No Known Allergies   No current outpatient medications on file.  History of Present Illness: Here for pap and physical.  Had Mirena replaced in 2015.  Spotted a few times in November and December.  None since. Never bleeds otherwise.  Has had a few "twnges" I RLQ over the past week.    Review of Systems   Patient denies any headaches, blurred vision, shortness of breath, chest pain, abdominal pain, problems with bowel movements, urination, or intercourse.   Physical Exam: General:  Well developed, well nourished, no acute distress Skin:  Warm and dry Neck:  Midline trachea, normal thyroid Lungs; Clear to auscultation bilaterally Breast:  No dominant palpable mass, retraction, or nipple discharge Cardiovascular: Regular rate and rhythm Abdomen:  Soft, non tender, no hepatosplenomegaly Pelvic:  External genitalia is normal in appearance.  The vagina is normal in appearance.  The cervix is bulbous. Strings visible Uterus is felt to be normal size, shape, and contour.  No adnexal masses or tenderness noted on left. Sl tenderness on L, exam limited by habitus.  Extremities:  No swelling or varicosities noted Psych:  No mood changes.     Impression:  Normal GYN  exam  If pain increases, changes, or is persistent, consider US   Plan: replace IUD 11/20

## 2018-01-10 LAB — HIV ANTIBODY (ROUTINE TESTING W REFLEX): HIV SCREEN 4TH GENERATION: NONREACTIVE

## 2018-01-10 LAB — RPR: RPR: NONREACTIVE

## 2018-01-11 ENCOUNTER — Encounter: Payer: Self-pay | Admitting: Advanced Practice Midwife

## 2018-01-11 ENCOUNTER — Other Ambulatory Visit: Payer: Self-pay | Admitting: Advanced Practice Midwife

## 2018-01-11 LAB — CYTOLOGY - PAP
Chlamydia: NEGATIVE
Diagnosis: NEGATIVE
Neisseria Gonorrhea: NEGATIVE

## 2018-01-11 MED ORDER — METRONIDAZOLE 500 MG PO TABS: 500.0000 mg | ORAL_TABLET | Freq: Two times a day (BID) | ORAL | 0 refills | Status: DC

## 2018-01-11 NOTE — Progress Notes (Signed)
fl

## 2018-01-26 ENCOUNTER — Other Ambulatory Visit: Payer: Self-pay | Admitting: Advanced Practice Midwife

## 2018-01-26 DIAGNOSIS — A5901 Trichomonal vulvovaginitis: Secondary | ICD-10-CM

## 2018-01-26 NOTE — Progress Notes (Signed)
Notified of + Trich (didn't see mychart message)  POC test ordered and released in Epic for 2 weeks

## 2018-02-26 ENCOUNTER — Telehealth: Payer: Self-pay | Admitting: Obstetrics & Gynecology

## 2018-02-26 NOTE — Telephone Encounter (Signed)
Spoke to pharmacy.  States they do have the prescription. The patient never picked it up so it was put back in inventory.  Will get filled for patient to p/u.    Called patient to make her aware but VM full.

## 2018-02-26 NOTE — Telephone Encounter (Signed)
Informed patient prescription was at pharmacy. Verbalized understanding.

## 2018-10-28 ENCOUNTER — Other Ambulatory Visit: Payer: Self-pay

## 2018-10-28 ENCOUNTER — Encounter (HOSPITAL_COMMUNITY): Payer: Self-pay | Admitting: *Deleted

## 2018-10-28 DIAGNOSIS — F1410 Cocaine abuse, uncomplicated: Secondary | ICD-10-CM | POA: Insufficient documentation

## 2018-10-28 DIAGNOSIS — F329 Major depressive disorder, single episode, unspecified: Secondary | ICD-10-CM | POA: Diagnosis not present

## 2018-10-28 DIAGNOSIS — R443 Hallucinations, unspecified: Secondary | ICD-10-CM | POA: Diagnosis present

## 2018-10-28 DIAGNOSIS — Z79899 Other long term (current) drug therapy: Secondary | ICD-10-CM | POA: Insufficient documentation

## 2018-10-28 DIAGNOSIS — F1110 Opioid abuse, uncomplicated: Secondary | ICD-10-CM | POA: Insufficient documentation

## 2018-10-28 DIAGNOSIS — F17210 Nicotine dependence, cigarettes, uncomplicated: Secondary | ICD-10-CM | POA: Insufficient documentation

## 2018-10-28 NOTE — ED Triage Notes (Signed)
Ems was called out by [employer] for evaluation and transport of pt who reports that pt was hallucinating after "doing cocaine" tonight, pt alert, able to answer questions, states " I just need to get some sleep" denies any auditory or visual hallucinations,

## 2018-10-29 ENCOUNTER — Emergency Department (HOSPITAL_COMMUNITY)
Admission: EM | Admit: 2018-10-29 | Discharge: 2018-10-29 | Disposition: A | Discharge status: Home / Self Care | Payer: Medicaid Other | Attending: Emergency Medicine | Admitting: Emergency Medicine

## 2018-10-29 DIAGNOSIS — F1410 Cocaine abuse, uncomplicated: Secondary | ICD-10-CM

## 2018-10-29 DIAGNOSIS — F1110 Opioid abuse, uncomplicated: Secondary | ICD-10-CM

## 2018-10-29 LAB — BASIC METABOLIC PANEL
Anion gap: 5 (ref 5–15)
BUN: 11 mg/dL (ref 6–20)
CO2: 25 mmol/L (ref 22–32)
Calcium: 8.4 mg/dL — ABNORMAL LOW (ref 8.9–10.3)
Chloride: 105 mmol/L (ref 98–111)
Creatinine, Ser: 0.74 mg/dL (ref 0.44–1.00)
GFR calc Af Amer: >60 mL/min (ref >60)
GFR calc non Af Amer: >60 mL/min (ref >60)
Glucose, Bld: 162 mg/dL — ABNORMAL HIGH (ref 70–99)
Potassium: 3.2 mmol/L — ABNORMAL LOW (ref 3.5–5.1)
SODIUM: 135 mmol/L (ref 135–145)

## 2018-10-29 LAB — HEPATIC FUNCTION PANEL
ALT: 13 U/L (ref 0–44)
AST: 20 U/L (ref 15–41)
Albumin: 3.7 g/dL (ref 3.5–5.0)
Alkaline Phosphatase: 59 U/L (ref 38–126)
BILIRUBIN INDIRECT: 0.6 mg/dL (ref 0.3–0.9)
Bilirubin, Direct: 0.1 mg/dL (ref 0.0–0.2)
Total Bilirubin: 0.7 mg/dL (ref 0.3–1.2)
Total Protein: 7.3 g/dL (ref 6.5–8.1)

## 2018-10-29 LAB — ETHANOL

## 2018-10-29 LAB — ACETAMINOPHEN LEVEL: Acetaminophen (Tylenol), Serum: <10 ug/mL — ABNORMAL LOW (ref 10–30)

## 2018-10-29 LAB — CBC WITH DIFFERENTIAL/PLATELET
Abs Immature Granulocytes: 0.05 10*3/uL (ref 0.00–0.07)
Basophils Absolute: 0.1 10*3/uL (ref 0.0–0.1)
Basophils Relative: 1 %
Eosinophils Absolute: 0.2 10*3/uL (ref 0.0–0.5)
Eosinophils Relative: 1 %
HCT: 33.9 % — ABNORMAL LOW (ref 36.0–46.0)
HEMOGLOBIN: 11.4 g/dL — AB (ref 12.0–15.0)
Immature Granulocytes: 0 %
LYMPHS PCT: 17 %
Lymphs Abs: 2.2 10*3/uL (ref 0.7–4.0)
MCH: 31.6 pg (ref 26.0–34.0)
MCHC: 33.6 g/dL (ref 30.0–36.0)
MCV: 93.9 fL (ref 80.0–100.0)
Monocytes Absolute: 1.1 10*3/uL — ABNORMAL HIGH (ref 0.1–1.0)
Monocytes Relative: 8 %
Neutro Abs: 9.7 10*3/uL — ABNORMAL HIGH (ref 1.7–7.7)
Neutrophils Relative %: 73 %
Platelets: 317 10*3/uL (ref 150–400)
RBC: 3.61 MIL/uL — AB (ref 3.87–5.11)
RDW: 11.6 % (ref 11.5–15.5)
WBC: 13.2 10*3/uL — ABNORMAL HIGH (ref 4.0–10.5)
nRBC: 0 % (ref 0.0–0.2)

## 2018-10-29 LAB — SALICYLATE LEVEL: Salicylate Lvl: <7 mg/dL (ref 2.8–30.0)

## 2018-10-29 MED ORDER — LORAZEPAM 1 MG PO TABS: 0.0000 mg | ORAL_TABLET | Freq: Four times a day (QID) | ORAL | Status: DC

## 2018-10-29 MED ORDER — LORAZEPAM 1 MG PO TABS: 0.0000 mg | ORAL_TABLET | Freq: Two times a day (BID) | ORAL | Status: DC

## 2018-10-29 MED ORDER — VITAMIN B-1 100 MG PO TABS: 100.0000 mg | ORAL_TABLET | Freq: Every day | ORAL | Status: DC

## 2018-10-29 MED ORDER — THIAMINE HCL 100 MG/ML IJ SOLN: 100.0000 mg | Freq: Every day | INTRAMUSCULAR | Status: DC

## 2018-10-29 MED ORDER — LORAZEPAM 2 MG/ML IJ SOLN: 0.0000 mg | Freq: Four times a day (QID) | INTRAMUSCULAR | Status: DC

## 2018-10-29 MED ORDER — LORAZEPAM 2 MG/ML IJ SOLN: 0.0000 mg | Freq: Two times a day (BID) | INTRAMUSCULAR | Status: DC

## 2018-10-29 NOTE — ED Notes (Signed)
[facility] TTS Counselor called to inform RN Pt will need to be reassessed later today. Pt had difficulty communicating with counselor during initial assessment.

## 2018-10-29 NOTE — ED Notes (Signed)
IVC paperwork served to pt by Lelon Huh with [employer] and witnessed by [facility] with security,

## 2018-10-29 NOTE — ED Notes (Signed)
Pt changed into scrubs, belongings secured, security wanded. Provided pt with ginger ale and graham crackers and informed of need of urine specimen.

## 2018-10-29 NOTE — ED Provider Notes (Signed)
[facility] EMERGENCY DEPARTMENT Provider Note   CSN: [id number] Arrival date & time: 10/28/18  2252     History   Chief Complaint Chief Complaint  Patient presents with  . V70.1    HPI Brandy Farrell is a 28 y.o. female.  Patient brought in by EMS with "not being able to sleep" as well as "hallucinations".  States she has been using heroin as well as cocaine for the past several days.  She has been snorting these denies any IV drug abuse.  Patient's mother called EMS because she was "going crazy".  Patient's mother is not here but did file IVC paperwork that states patient has been abusing drugs and hallucinating and acting out.  Patient is calm and cooperative.  She denies any suicidal homicidal thoughts.  She denies hearing any voices.  She admits to using cocaine and heroin.  Denies IV drug abuse.  Denies taking pills.  She does drink alcohol but not every day.  Does admit to some alcohol tonight.  Denies any other medical problems and does not take medications.  She denies any chest pain or shortness of breath.  No abdominal pain, nausea, vomiting or diarrhea.  IVC paperwork was completed by mother after patient arrived in the ED.  She was worried that patient could potentially harm herself.  The history is provided by the patient and the EMS personnel.    History reviewed. No pertinent past medical history.  Patient Active Problem List   Diagnosis Date Noted  . 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 No pertinent surgical history.   OB History    Gravida  1   Para  1   Term      Preterm      AB      Living  1     SAB      TAB      Ectopic      Multiple      Live Births               Home Medications    Prior to Admission medications   Medication Sig Start Date End Date Taking? Authorizing Provider  metroNIDAZOLE (FLAGYL) 500 MG tablet Take 1 tablet (500 mg total) by mouth 2 (two) times daily. 01/11/18   Jacklyn Shell,  CNM    Family History Family History  Problem Relation Age of Onset  . Hypertension Mother   . Cancer Maternal Grandmother        colon  . Cancer Other        PGGM    Social History Social History   Tobacco Use  . Smoking status: Current Some Day Smoker    Packs/day: 1.00    Types: Cigarettes  . Smokeless tobacco: Never Used  Substance Use Topics  . Alcohol use: Yes    Comment: occasionally  . Drug use: Yes    Types: Cocaine     Allergies   Patient has no known allergies.   Review of Systems Review of Systems  Constitutional: Positive for activity change and appetite change. Negative for fever.  HENT: Negative for congestion and rhinorrhea.   Eyes: Negative for visual disturbance.  Respiratory: Negative for cough, chest tightness and shortness of breath.   Cardiovascular: Negative for chest pain.  Gastrointestinal: Negative for abdominal pain, nausea and vomiting.  Genitourinary: Negative for dysuria, hematuria, vaginal bleeding and vaginal discharge.  Musculoskeletal: Negative for arthralgias and myalgias.  Skin: Negative for rash.  Neurological:  Negative for dizziness, weakness and headaches.  Psychiatric/Behavioral: Positive for behavioral problems, confusion, hallucinations and sleep disturbance. Negative for self-injury.   all other systems are negative except as noted in the HPI and PMH.     Physical Exam Updated Vital Signs BP (!) 150/90 (BP Location: Right Arm)   Pulse (!) 103   Temp 98.3 F (36.8 C) (Oral)   Resp 18   Ht 5\' 7"  (1.702 m)   Wt 70.3 kg   SpO2 96%   BMI 24.28 kg/m   Physical Exam Vitals signs and nursing note reviewed.  Constitutional:      General: She is not in acute distress.    Appearance: Normal appearance. She is well-developed and normal weight. She is not ill-appearing.     Comments: Sleepy but arousable, answers questions appropriately  HENT:     Head: Normocephalic and atraumatic.     Mouth/Throat:     Pharynx: No  oropharyngeal exudate.  Eyes:     Conjunctiva/sclera: Conjunctivae normal.     Pupils: Pupils are equal, round, and reactive to light.  Neck:     Musculoskeletal: Normal range of motion and neck supple.     Comments: No meningismus. Cardiovascular:     Rate and Rhythm: Normal rate and regular rhythm.     Heart sounds: Normal heart sounds. No murmur.  Pulmonary:     Effort: Pulmonary effort is normal. No respiratory distress.     Breath sounds: Normal breath sounds.  Abdominal:     Palpations: Abdomen is soft.     Tenderness: There is no abdominal tenderness. There is no guarding or rebound.  Musculoskeletal: Normal range of motion.        General: No tenderness.  Skin:    General: Skin is warm.     Capillary Refill: Capillary refill takes less than 2 seconds.     Comments: Multiple skin lesions and track marks  Neurological:     General: No focal deficit present.     Mental Status: She is alert and oriented to person, place, and time.     Cranial Nerves: No cranial nerve deficit.     Motor: No abnormal muscle tone.     Coordination: Coordination normal.     Comments:  5/5 strength throughout. CN 2-12 intact.Equal grip strength.   Psychiatric:        Behavior: Behavior normal.      ED Treatments / Results  Labs (all labs ordered are listed, but only abnormal results are displayed) Labs Reviewed  CBC WITH DIFFERENTIAL/PLATELET - Abnormal; Notable for the following components:      Result Value   WBC 13.2 (*)    RBC 3.61 (*)    Hemoglobin 11.4 (*)    HCT 33.9 (*)    Neutro Abs 9.7 (*)    Monocytes Absolute 1.1 (*)    All other components within normal limits  BASIC METABOLIC PANEL - Abnormal; Notable for the following components:   Potassium 3.2 (*)    Glucose, Bld 162 (*)    Calcium 8.4 (*)    All other components within normal limits  ACETAMINOPHEN LEVEL - Abnormal; Notable for the following components:   Acetaminophen (Tylenol), Serum <10 (*)    All other  components within normal limits  ETHANOL  HEPATIC FUNCTION PANEL  SALICYLATE LEVEL  URINALYSIS, ROUTINE W REFLEX MICROSCOPIC  PREGNANCY, URINE  RAPID URINE DRUG SCREEN, HOSP PERFORMED    EKG None  Radiology No results found.  Procedures Procedures (  including critical care time)  Medications Ordered in ED Medications - No data to display   Initial Impression / Assessment and Plan / ED Course  I have reviewed the triage vital signs and the nursing notes.  Pertinent labs & imaging results that were available during my care of the patient were reviewed by me and considered in my medical decision making (see chart for details).    Drug abuse with aggressive behavior and hallucinations.  Denies SI or HI.  Denies somatic complaint.  Screening labs reassuring. Patient is medically clear for TTS evaluation.  TTS has seen patient.  They would like her to be reassessed by psychiatry in the morning.  She denies any suicidal ideation.  Holding orders placed.  CIWA orders placed.  Final Clinical Impressions(s) / ED Diagnoses   Final diagnoses:  Cocaine abuse [facility](HCC)  Heroin abuse [facility](HCC)    ED Discharge Orders    None       Glynn Octaveancour, Briann Sarchet, MD 10/29/18 [phone]

## 2018-10-29 NOTE — ED Notes (Signed)
IVC paperwork faxed 

## 2018-10-29 NOTE — ED Notes (Signed)
ED Provider at bedside. 

## 2018-10-29 NOTE — ED Notes (Signed)
Tele psych being performed at present time,  

## 2018-10-29 NOTE — ED Notes (Signed)
Mom came out of room to nursing desk and advised that she had been to the magistrate's office and taken out ivc paperwork on pt, mom reports that pt has been depressed and she is afraid that pt may harm herself,

## 2018-10-29 NOTE — Discharge Instructions (Addendum)
Follow up as instructed by behavior health 

## 2018-10-29 NOTE — ED Notes (Signed)
Pt states that she does not understand what is going on, Rn at bedside and explained to pt three separate times process of IVC, Kathlene November with [employer] also at bedside and explained the process of IVC as well,

## 2018-10-29 NOTE — Consult Note (Signed)
Telepsych Consultation   Reason for Consult:  IVC'd Referring Physician: EDP Location of Patient: [facility] Location of Provider: [facility]  Patient Identification: Brandy Farrell MRN:  [id number] Principal Diagnosis: <principal problem not specified> Diagnosis:  Active Problems:   * No active hospital problems. *   Total Time spent with patient: 15 minutes  Subjective:   Brandy Farrell is a 28 y.o. female was evaluated via tele-assessment.  She is awake alert and oriented x3.  Presents slightly irritable throughout this assessment.  Patient continues to deny suicidal or homicidal ideations.  Denies auditory or visual hallucinations.  Reports " I am here because I was not feeling good and had a moment on yesterday."  Patient was asked to elaborate with her " moment" statement.  " I just was not feeling good."  Patient provided verbal permission to follow-up with her mother.  NP attempted to contact number provided by patient.  [name]? [phone] unable to leave a message.  Patient was requesting assistance with her substance abuse addiction.  Patient was not very forthcoming with substance that she uses.  Denies previous inpatient admissions.  Denies family history of mental illness.  Denies taking medications daily ie antidepressants/mood stabilization medications.  Case staffed with MD Lucianne MussKumar, TTS to provide additional substance abuse resources.  Support encouragement reassurance was provided.   HPI: Per initial assessment- Brandy Farrell is an 28 y.o. female, who presents involuntary and unaccompanied to [facility]. Pt was a poor historian during the assessment and was redirected the pt to answer questions. Clinician asked the pt, "what brought you to the hospital?" Pt reported, "my mama said I need some help, because I wasn't acting like myself." Pt reported, "I tripped out, got out of a bad relationship." Pt reported she was addicted to opiates (pain pills) and was  introduced to something cheaper (cocaine, heroin). Clinician asked the pt what triggered her to start using drugs. Clinician observed the pt crying. Pt did not answer. Pt denies, SI, HI, AVH, self-injurious behaviors and access to weapons.   Pr was IVC'd by her mother. Per IVC paperwork: "Subject has been snorting heroin and thinks people are after her. Mother states this is the first time she has known her to try drugs. Subject is frantic and out if reality."   Pt denies abuse. Pt's UDS is pending. Pt reported, using heroin and cocaine. Pt reported, she sniffed heroin before she get to the hospital. Pt denies, being linked to OPT resources (medication management and/or counseling.) Pt denies, previous inpatient admissions.   Pt presents restless, disheveled with slurred, soft speech. Pt's eye contact was fair (pr would stare off at times.) Pt's mood was depressed, helpless. Pt's thought process was relevant. Pt's  Judgement was impaired. Pt was oriented x4. Pt's insight and impulse control are poor. Pt reported, if discharged from [facility] she could contract for safety.   Past Psychiatric History:   Risk to Self: Suicidal Ideation: No(Pt denies. ) Suicidal Intent: No Is patient at risk for suicide?: No Suicidal Plan?: No Access to Means: No What has been your use of drugs/alcohol within the last 12 months?: Heroin, cocaine. How many times?: 0 Other Self Harm Risks: Drug use. Triggers for Past Attempts: None known Intentional Self Injurious Behavior: None Risk to Others: Homicidal Ideation: No(Pt denies. ) Thoughts of Harm to Others: No(Pt denies. ) Current Homicidal Intent: No Current Homicidal Plan: No(Pt denies. ) Access to Homicidal Means: No Identified Victim: NA History of harm to  others?: No Assessment of Violence: None Noted Violent Behavior Description: NA Does patient have access to weapons?: No Criminal Charges Pending?: No Does patient have a court date: No Prior Inpatient  Therapy: Prior Inpatient Therapy: No Prior Outpatient Therapy: Prior Outpatient Therapy: No Does patient have an ACCT team?: No Does patient have Intensive In-House Services?  : No Does patient have [facility] services? : No Does patient have P4CC services?: No  Past Medical History: History reviewed. No pertinent past medical history. History reviewed. No pertinent surgical history. Family History:  Family History  Problem Relation Age of Onset  . Hypertension Mother   . Cancer Maternal Grandmother        colon  . Cancer Other        PGGM   Family Psychiatric  History:  Social History:  Social History   Substance and Sexual Activity  Alcohol Use Yes   Comment: occasionally     Social History   Substance and Sexual Activity  Drug Use Yes  . Types: Cocaine    Social History   Socioeconomic History  . Marital status: Single    Spouse name: Not on file  . Number of children: Not on file  . Years of education: Not on file  . Highest education level: Not on file  Occupational History  . Not on file  Social Needs  . Financial resource strain: Not on file  . Food insecurity:    Worry: Not on file    Inability: Not on file  . Transportation needs:    Medical: Not on file    Non-medical: Not on file  Tobacco Use  . Smoking status: Current Some Day Smoker    Packs/day: 1.00    Types: Cigarettes  . Smokeless tobacco: Never Used  Substance and Sexual Activity  . Alcohol use: Yes    Comment: occasionally  . Drug use: Yes    Types: Cocaine  . Sexual activity: Yes    Birth control/protection: I.U.D.  Lifestyle  . Physical activity:    Days per week: Not on file    Minutes per session: Not on file  . Stress: Not on file  Relationships  . Social connections:    Talks on phone: Not on file    Gets together: Not on file    Attends religious service: Not on file    Active member of club or organization: Not on file    Attends meetings of clubs or organizations: Not on  file    Relationship status: Not on file  Other Topics Concern  . Not on file  Social History Narrative  . Not on file   Additional Social History:    Allergies:  No Known Allergies  Labs:  Results for orders placed or performed during the hospital encounter of 10/29/18 (from the past 48 hour(s))  CBC with Differential     Status: Abnormal   Collection Time: 10/29/18  1:37 AM  Result Value Ref Range   WBC 13.2 (H) 4.0 - 10.5 K/uL   RBC 3.61 (L) 3.87 - 5.11 MIL/uL   Hemoglobin 11.4 (L) 12.0 - 15.0 g/dL   HCT 92.4 (L) 26.8 - 34.1 %   MCV 93.9 80.0 - 100.0 fL   MCH 31.6 26.0 - 34.0 pg   MCHC 33.6 30.0 - 36.0 g/dL   RDW 96.2 22.9 - 79.8 %   Platelets 317 150 - 400 K/uL   nRBC 0.0 0.0 - 0.2 %   Neutrophils Relative % 73 %  Neutro Abs 9.7 (H) 1.7 - 7.7 K/uL   Lymphocytes Relative 17 %   Lymphs Abs 2.2 0.7 - 4.0 K/uL   Monocytes Relative 8 %   Monocytes Absolute 1.1 (H) 0.1 - 1.0 K/uL   Eosinophils Relative 1 %   Eosinophils Absolute 0.2 0.0 - 0.5 K/uL   Basophils Relative 1 %   Basophils Absolute 0.1 0.0 - 0.1 K/uL   Immature Granulocytes 0 %   Abs Immature Granulocytes 0.05 0.00 - 0.07 K/uL    Comment: Performed at [facility], [address] [place], [place] [address]  Basic metabolic panel     Status: Abnormal   Collection Time: 10/29/18  1:37 AM  Result Value Ref Range   Sodium 135 135 - 145 mmol/L   Potassium 3.2 (L) 3.5 - 5.1 mmol/L   Chloride 105 98 - 111 mmol/L   CO2 25 22 - 32 mmol/L   Glucose, Bld 162 (H) 70 - 99 mg/dL   BUN 11 6 - 20 mg/dL   Creatinine, Ser 2.22 0.44 - 1.00 mg/dL   Calcium 8.4 (L) 8.9 - 10.3 mg/dL   GFR calc non Af Amer >60 >60 mL/min   GFR calc Af Amer >60 >60 mL/min   Anion gap 5 5 - 15    Comment: Performed at [facility], [address] [place], [place] [address]  Ethanol     Status: None   Collection Time: 10/29/18  1:37 AM  Result Value Ref Range   Alcohol, Ethyl (B) <10 <10 mg/dL    Comment: Performed at [facility],  [address] [place], [place] [address]  Hepatic function panel     Status: None   Collection Time: 10/29/18  1:37 AM  Result Value Ref Range   Total Protein 7.3 6.5 - 8.1 g/dL   Albumin 3.7 3.5 - 5.0 g/dL   AST 20 15 - 41 U/L   ALT 13 0 - 44 U/L   Alkaline Phosphatase 59 38 - 126 U/L   Total Bilirubin 0.7 0.3 - 1.2 mg/dL   Bilirubin, Direct 0.1 0.0 - 0.2 mg/dL   Indirect Bilirubin 0.6 0.3 - 0.9 mg/dL    Comment: Performed at [facility], [address], [place], [place] [address]  Acetaminophen level     Status: Abnormal   Collection Time: 10/29/18  1:37 AM  Result Value Ref Range   Acetaminophen (Tylenol), Serum <10 (L) 10 - 30 ug/mL    Comment: (NOTE) Therapeutic concentrations vary significantly. A range of 10-30 ug/mL  may be an effective concentration for many patients. However, some  are best treated at concentrations outside of this range. Acetaminophen concentrations >150 ug/mL at 4 hours after ingestion  and >50 ug/mL at 12 hours after ingestion are often associated with  toxic reactions. Performed at [facility], [address] [place], [place] [address]   Salicylate level     Status: None   Collection Time: 10/29/18  1:37 AM  Result Value Ref Range   Salicylate Lvl <7.0 2.8 - 30.0 mg/dL    Comment: Performed at [facility], [address] [place], [place] [address]    Medications:  Current Facility-Administered Medications  Medication Dose Route Frequency Provider Last Rate Last Dose  . LORazepam (ATIVAN) injection 0-4 mg  0-4 mg Intravenous Q6H Rancour, Jeannett Senior, MD   Stopped at 10/29/18 0301   Or  . LORazepam (ATIVAN) tablet 0-4 mg  0-4 mg Oral Q6H Rancour, Jeannett Senior, MD      . [  START ON 10/31/2018] LORazepam (ATIVAN) injection 0-4 mg  0-4 mg Intravenous Q12H Rancour, Jeannett SeniorStephen, MD       Or  . Melene Muller[START ON 10/31/2018] LORazepam (ATIVAN) tablet 0-4 mg  0-4 mg Oral Q12H Rancour, Stephen, MD      . thiamine (VITAMIN B-1) tablet 100 mg  100 mg Oral Daily Rancour, Stephen, MD        Or  . thiamine (B-1) injection 100 mg  100 mg Intravenous Daily Rancour, Stephen, MD       Current Outpatient Medications  Medication Sig Dispense Refill  . metroNIDAZOLE (FLAGYL) 500 MG tablet Take 1 tablet (500 mg total) by mouth 2 (two) times daily. 14 tablet 0    Musculoskeletal: Strength & Muscle Tone:  Gait & Station:  Patient leans: Observed resting in bed, slightly fidgety  Psychiatric Specialty Exam: Physical Exam  Constitutional: She appears well-developed.  Psychiatric: She has a normal mood and affect. Her behavior is normal.    Review of Systems  Psychiatric/Behavioral: Positive for depression and substance abuse. Negative for hallucinations and suicidal ideas.  All other systems reviewed and are negative.   Blood pressure 131/88, pulse 62, temperature 97.6 F (36.4 C), temperature source Oral, resp. rate 16, height 5\' 7"  (1.702 m), weight 70.3 kg, SpO2 98 %.Body mass index is 24.28 kg/m.  General Appearance: Guarded paper scrubs  Eye Contact:  Fair  Speech:  Clear and Coherent  Volume:  Normal  Mood:  Irritable  Affect:  Congruent, Depressed and Flat  Thought Process:  Coherent  Orientation:  Full (Time, Place, and Person)  Thought Content:  Logical  Suicidal Thoughts:  No  Homicidal Thoughts:  No  Memory:  Immediate;   Fair Recent;   Fair Remote;   Fair  Judgement:  Fair  Insight:  Fair  Psychomotor Activity:  Normal  Concentration:  Concentration: Fair  Recall:  [employer]Fair  Fund of Knowledge:  Fair  Language:  Fair  Akathisia:  No  Handed:  Right  AIMS (if indicated):     Assets:  Communication Skills Desire for Improvement Social Support Transportation  ADL's:  Intact  Cognition:  WNL  Sleep:      NP T.Melvyn NethLewis spoke with MD Zammit regarding discharge disposition recommendations TTS-to provide additional substance abuse outpatient follow-up resources.   Disposition: No evidence of imminent risk to self or others at present.   Patient does not  meet criteria for psychiatric inpatient admission. Supportive therapy provided about ongoing stressors. Refer to IOP. Discussed crisis plan, support from social network, calling 911, coming to the Emergency Department, and calling Suicide Hotline.  This service was provided via telemedicine using a 2-way, interactive audio and video technology.  Names of all persons participating in this telemedicine service and their role in this encounter. Name: Karl PockWhitney Tischer  Role: patient   Name: T.Keamber Macfadden Role: NP    Oneta Rackanika N Ihsan Nomura, NP 10/29/2018 9:21 AM

## 2018-10-29 NOTE — ED Notes (Signed)
RN attempted to wake Pt up to perform Opiate Withdrawal Assessment and determine level of treatment with PO Ativan for Pt. Pt difficult to arouse, but when awake, Pt responded "im fine, I just want to get some sleep right now." RN advised Pt to let staff know if she began feeling sick and we would help her.

## 2018-10-29 NOTE — ED Notes (Signed)
Pt given breakfast tray
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
## 2019-02-12 ENCOUNTER — Encounter: Payer: Self-pay | Admitting: *Deleted

## 2019-02-13 ENCOUNTER — Ambulatory Visit: Payer: Self-pay | Admitting: Women's Health

## 2019-04-24 ENCOUNTER — Other Ambulatory Visit: Payer: Self-pay | Admitting: Adult Health

## 2020-04-23 ENCOUNTER — Other Ambulatory Visit: Payer: Self-pay | Admitting: Advanced Practice Midwife

## 2021-05-12 ENCOUNTER — Other Ambulatory Visit: Payer: Medicaid Other | Admitting: Advanced Practice Midwife

## 2021-06-01 ENCOUNTER — Other Ambulatory Visit: Payer: Medicaid Other | Admitting: Obstetrics & Gynecology

## 2021-09-14 ENCOUNTER — Ambulatory Visit (INDEPENDENT_AMBULATORY_CARE_PROVIDER_SITE_OTHER): Payer: Medicaid Other | Admitting: Women's Health

## 2021-09-14 ENCOUNTER — Encounter: Payer: Self-pay | Admitting: Women's Health

## 2021-09-14 ENCOUNTER — Other Ambulatory Visit: Payer: Self-pay

## 2021-09-14 ENCOUNTER — Other Ambulatory Visit (HOSPITAL_COMMUNITY)
Admission: RE | Admit: 2021-09-14 | Discharge: 2021-09-14 | Disposition: A | Discharge status: Home / Self Care | Payer: Medicaid Other | Source: Ambulatory Visit | Attending: Women's Health | Admitting: Women's Health

## 2021-09-14 VITALS — BP 131/83 | HR 81 | Ht 67.0 in | Wt 191.0 lb

## 2021-09-14 DIAGNOSIS — Z113 Encounter for screening for infections with a predominantly sexual mode of transmission: Secondary | ICD-10-CM

## 2021-09-14 DIAGNOSIS — Z01419 Encounter for gynecological examination (general) (routine) without abnormal findings: Secondary | ICD-10-CM | POA: Insufficient documentation

## 2021-09-14 DIAGNOSIS — Z975 Presence of (intrauterine) contraceptive device: Secondary | ICD-10-CM

## 2021-09-14 NOTE — Progress Notes (Signed)
WELL-WOMAN EXAMINATION Patient name: Brandy Farrell MRN [id number]  Date of birth: 1990-12-24 Chief Complaint:   New Patient (Initial Visit) and Gynecologic Exam  History of Present Illness:   Brandy Farrell is a 30 y.o. G57P1001 African-American female being seen today for a routine well-woman exam.  Current complaints: some vaginal d/c, no itching/odor/irritation, wants STD screening  PCP: [facility]      No LMP recorded (lmp unknown). (Menstrual status: IUD). The current method of family planning is IUD. Liletta inserted 08/13/14 Last pap 01/09/2018. Results were:  neg . H/O abnormal pap: no Last mammogram: never. Results were: N/A. Family h/o breast cancer: no Last colonoscopy: never. Results were: N/A. Family h/o colorectal cancer: yes MGM  Depression screen [facility] 2/9 09/14/2021 01/09/2018  Decreased Interest 0 1  Down, Depressed, Hopeless 0 -  PHQ - 2 Score 0 1  Altered sleeping 0 1  Tired, decreased energy 0 1  Change in appetite 0 3  Feeling bad or failure about yourself  0 -  Trouble concentrating 0 0  Moving slowly or fidgety/restless 0 0  Suicidal thoughts 0 0  PHQ-9 Score 0 6  Difficult doing work/chores - Somewhat difficult     GAD 7 : Generalized Anxiety Score 09/14/2021  Nervous, Anxious, on Edge 0  Control/stop worrying 0  Worry too much - different things 0  Trouble relaxing 0  Restless 0  Easily annoyed or irritable 0  Afraid - awful might happen 0  Total GAD 7 Score 0     Review of Systems:   Pertinent items are noted in HPI Denies any headaches, blurred vision, fatigue, shortness of breath, chest pain, abdominal pain, abnormal vaginal discharge/itching/odor/irritation, problems with periods, bowel movements, urination, or intercourse unless otherwise stated above. Pertinent History Reviewed:  Reviewed past medical,surgical, social and family history.  Reviewed problem list, medications and allergies. Physical Assessment:   Vitals:    09/14/21 0904  BP: 131/83  Pulse: 81  Weight: 191 lb (86.6 kg)  Height: 5\' 7"  (1.702 m)  Body mass index is 29.91 kg/m.        Physical Examination:   General appearance - well appearing, and in no distress  Mental status - alert, oriented to person, place, and time  Psych:  She has a normal mood and affect  Skin - warm and dry, normal color, no suspicious lesions noted  Chest - effort normal, all lung fields clear to auscultation bilaterally  Heart - normal rate and regular rhythm  Neck:  midline trachea, no thyromegaly or nodules  Breasts - breasts appear normal, no suspicious masses, no skin or nipple changes or  axillary nodes  Abdomen - soft, nontender, nondistended, no masses or organomegaly  Pelvic - VULVA: normal appearing vulva with no masses, tenderness or lesions  VAGINA: normal appearing vagina with normal color and discharge, no lesions  CERVIX: normal appearing cervix without discharge or lesions, no CMT, IUD strings visible  Thin prep pap is done w/ HR HPV cotesting  UTERUS: uterus is felt to be normal size, shape, consistency and nontender   ADNEXA: No adnexal masses or tenderness noted.  Extremities:  No swelling or varicosities noted  Chaperone: Angel Neas    No results found for this or any previous visit (from the past 24 hour(s)).  Assessment & Plan:  1) Well-Woman Exam  2) STD screen  Labs/procedures today: pap w/ gc/ct, HIV, RPR   Mammogram: @ 32yo, or sooner if problems Colonoscopy: @ 32yo, or sooner  if problems  Orders Placed This Encounter  Procedures   HIV Antibody (routine testing w rflx)   RPR    Meds: No orders of the defined types were placed in this encounter.   Follow-up: Return for Oct 2023 for IUD removal; 5yr from now for physical.  Cheral Marker CNM, [facility] 09/14/2021 9:34 AM

## 2021-09-15 LAB — CYTOLOGY - PAP
Chlamydia: NEGATIVE
Comment: NEGATIVE
Comment: NEGATIVE
Comment: NORMAL
Diagnosis: NEGATIVE
High risk HPV: NEGATIVE
Neisseria Gonorrhea: NEGATIVE

## 2021-09-15 LAB — RPR: RPR Ser Ql: NONREACTIVE

## 2021-09-15 LAB — HIV ANTIBODY (ROUTINE TESTING W REFLEX): HIV Screen 4th Generation wRfx: NONREACTIVE

## 2021-09-20 DIAGNOSIS — A599 Trichomoniasis, unspecified: Secondary | ICD-10-CM | POA: Insufficient documentation

## 2021-09-20 MED ORDER — METRONIDAZOLE 500 MG PO TABS: 500.0000 mg | ORAL_TABLET | Freq: Two times a day (BID) | ORAL | 0 refills | Status: DC

## 2021-09-20 NOTE — Addendum Note (Signed)
Addended by: Cheral Marker on: 09/20/2021 12:46 PM   Modules accepted: Orders

## 2021-09-23 ENCOUNTER — Telehealth: Payer: Self-pay | Admitting: *Deleted

## 2021-09-23 NOTE — Telephone Encounter (Signed)
Left message @ 1:45 pm. JSY ?

## 2021-09-24 NOTE — Telephone Encounter (Signed)
Left message @ 10:43 am. JSY

## 2021-09-27 ENCOUNTER — Other Ambulatory Visit: Payer: Self-pay | Admitting: Women's Health

## 2021-09-27 NOTE — Telephone Encounter (Signed)
Pt aware pap was normal but did show trich. Med was sent to pharmacy. Take med as directed. Don't drink alcohol or have sex while on med. Pt wants you to treat partner. His name is Vickki Hearing, DOB [birth year] no allergies [facility] on [address] No sex for at least 7 days from the time both have finished med. Pt voiced understanding. Call transferred to [facility] for POC in 4 weeks. Thanks!! JSY

## 2021-09-27 NOTE — Progress Notes (Signed)
Metronidazole 2gm po x1 w/ 0RF called into [facility] for partner Vickki Hearing, DOB [birth year] , NKDA Cheral Marker, CNM, [facility] 09/27/2021 1:57 PM

## 2021-10-21 ENCOUNTER — Other Ambulatory Visit: Payer: Medicaid Other

## 2021-10-27 ENCOUNTER — Other Ambulatory Visit: Payer: Self-pay

## 2021-10-27 ENCOUNTER — Other Ambulatory Visit: Payer: Medicaid Other

## 2021-10-27 ENCOUNTER — Ambulatory Visit
Admission: EM | Admit: 2021-10-27 | Discharge: 2021-10-27 | Disposition: A | Discharge status: Home / Self Care | Payer: Medicaid Other | Attending: Urgent Care | Admitting: Urgent Care

## 2021-10-27 DIAGNOSIS — R519 Headache, unspecified: Secondary | ICD-10-CM

## 2021-10-27 DIAGNOSIS — H65191 Other acute nonsuppurative otitis media, right ear: Secondary | ICD-10-CM | POA: Diagnosis not present

## 2021-10-27 MED ORDER — AMOXICILLIN-POT CLAVULANATE 875-125 MG PO TABS: 1.0000 | ORAL_TABLET | Freq: Two times a day (BID) | ORAL | 0 refills | Status: DC

## 2021-10-27 MED ORDER — NAPROXEN 500 MG PO TABS: 500.0000 mg | ORAL_TABLET | Freq: Two times a day (BID) | ORAL | 0 refills | Status: DC

## 2021-10-27 MED ORDER — CETIRIZINE HCL 10 MG PO TABS: 10.0000 mg | ORAL_TABLET | Freq: Every day | ORAL | 0 refills | Status: DC

## 2021-10-27 MED ORDER — PSEUDOEPHEDRINE HCL 60 MG PO TABS: 60.0000 mg | ORAL_TABLET | Freq: Three times a day (TID) | ORAL | 0 refills | Status: DC | PRN
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
## 2021-10-27 NOTE — ED Provider Notes (Signed)
[facility]   MRN: [id number] DOB: 07-29-1991  Subjective:   Lauryl TRESSIA GOLIS is a 31 y.o. female presenting for 2-day history of acute onset severe right-sided ear pain with radiation into the right side of her head and right throat.  If she leans to that side it feels much worse including a sensation of having fluid in the ear.  She used a leftover amoxicillin that she had from a previous infection about 2 months ago.  States that she did not finish the course and really only took 3 days of it.  Has been using over-the-counter pain medication without relief.  No confusion, weakness, neck pain or stiffness, cough, chest pain, shortness of breath or wheezing.  She is a smoker, does a pack per day.  No current facility-administered medications for this encounter.  Current Outpatient Medications:    buprenorphine (SUBUTEX) 8 MG SUBL SL tablet, Place 8 mg under the tongue 2 (two) times daily., Disp: , Rfl:    levonorgestrel (LILETTA, 52 MG,) 20.1 MCG/DAY IUD, 1 each by Intrauterine route once., Disp: , Rfl:    metroNIDAZOLE (FLAGYL) 500 MG tablet, Take 1 tablet (500 mg total) by mouth 2 (two) times daily., Disp: 14 tablet, Rfl: 0   No Known Allergies  Past Medical History:  Diagnosis Date   History of narcotic addiction ([place])      History reviewed. No pertinent surgical history.  Family History  Problem Relation Age of Onset   Colon cancer Maternal Grandmother    Cancer Maternal Grandmother        colon   Hypertension Mother    Cancer Other        PGGM    Social History   Tobacco Use   Smoking status: Every Day    Packs/day: 1.00    Years: 11.00    Pack years: 11.00    Types: Cigarettes   Smokeless tobacco: Never  Vaping Use   Vaping Use: Never used  Substance Use Topics   Alcohol use: Yes    Comment: occasionally   Drug use: Not Currently    Types: Cocaine    ROS   Objective:   Vitals: BP (!) 154/91 (BP Location: Right Arm)    Pulse 80     Temp 98.7 F (37.1 C)    Resp 18    SpO2 96%   Physical Exam Constitutional:      General: She is not in acute distress.    Appearance: Normal appearance. She is well-developed and normal weight. She is not ill-appearing, toxic-appearing or diaphoretic.  HENT:     Head: Normocephalic and atraumatic.     Right Ear: Ear canal and external ear normal. Tenderness present. No drainage. A middle ear effusion is present. There is no impacted cerumen. Tympanic membrane is erythematous.     Left Ear: Tympanic membrane, ear canal and external ear normal. No drainage or tenderness.  No middle ear effusion. There is no impacted cerumen. Tympanic membrane is not erythematous.     Nose: Nose normal. No congestion or rhinorrhea.     Mouth/Throat:     Mouth: Mucous membranes are moist. No oral lesions.     Pharynx: No pharyngeal swelling, oropharyngeal exudate, posterior oropharyngeal erythema or uvula swelling.     Tonsils: No tonsillar exudate or tonsillar abscesses.  Eyes:     General: No scleral icterus.       Right eye: No discharge.        Left eye: No discharge.  Extraocular Movements: Extraocular movements intact.     Right eye: Normal extraocular motion.     Left eye: Normal extraocular motion.     Conjunctiva/sclera: Conjunctivae normal.  Cardiovascular:     Rate and Rhythm: Normal rate and regular rhythm.     Pulses: Normal pulses.     Heart sounds: Normal heart sounds. No murmur heard.   No friction rub. No gallop.  Pulmonary:     Effort: Pulmonary effort is normal. No respiratory distress.     Breath sounds: Normal breath sounds. No stridor. No wheezing, rhonchi or rales.  Musculoskeletal:     Cervical back: Normal range of motion and neck supple.  Lymphadenopathy:     Cervical: No cervical adenopathy.  Skin:    General: Skin is warm and dry.     Findings: No rash.  Neurological:     General: No focal deficit present.     Mental Status: She is alert and oriented to person,  place, and time.     Cranial Nerves: No cranial nerve deficit.     Motor: No weakness.     Coordination: Coordination normal.     Gait: Gait normal.     Comments: Negative Kernig and Brudzinski.  No neck stiffness.  Psychiatric:        Mood and Affect: Mood normal.        Behavior: Behavior normal.        Thought Content: Thought content normal.    Assessment and Plan :   PDMP not reviewed this encounter.  1. Other non-recurrent acute nonsuppurative otitis media of right ear   2. Right sided facial pain     Low suspicion for an acute encephalopathy. Start Augmentin to cover for otitis media. Use supportive care otherwise. Counseled patient on potential for adverse effects with medications prescribed/recommended today, ER and return-to-clinic precautions discussed, patient verbalized understanding.    Jaynee Eagles, PA-C 10/27/21 1428

## 2021-11-15 ENCOUNTER — Other Ambulatory Visit (HOSPITAL_COMMUNITY)
Admission: RE | Admit: 2021-11-15 | Discharge: 2021-11-15 | Disposition: A | Discharge status: Home / Self Care | Payer: Medicaid Other | Source: Ambulatory Visit | Attending: Obstetrics & Gynecology | Admitting: Obstetrics & Gynecology

## 2021-11-15 ENCOUNTER — Other Ambulatory Visit (INDEPENDENT_AMBULATORY_CARE_PROVIDER_SITE_OTHER): Payer: Medicaid Other

## 2021-11-15 ENCOUNTER — Other Ambulatory Visit: Payer: Self-pay

## 2021-11-15 DIAGNOSIS — Z202 Contact with and (suspected) exposure to infections with a predominantly sexual mode of transmission: Secondary | ICD-10-CM | POA: Diagnosis present

## 2021-11-15 NOTE — Progress Notes (Signed)
° °  NURSE VISIT- VAGINITIS/STD/POC  SUBJECTIVE:  Brandy Farrell is Brandy 31 y.o. G1P1001 GYN patientfemale here for Brandy vaginal swab for vaginitis screening, proof of cure after treatment for Trichomonas.  She reports the following symptoms: none. Denies abnormal vaginal bleeding, significant pelvic pain, fever, or UTI symptoms.  OBJECTIVE:  There were no vitals taken for this visit.  Appears well, in no apparent distress  ASSESSMENT: Vaginal swab for proof of cure after treatment for trichomonas  PLAN: Self-collected vaginal probe for Gonorrhea, Chlamydia, Trichomonas, Bacterial Vaginosis, Yeast per pt request sent to lab Treatment: to be determined once results are received Follow-up as needed if symptoms persist/worsen, or new symptoms develop  Brandy Farrell Brandy Farrell  11/15/2021 2:17 PM

## 2021-11-17 ENCOUNTER — Other Ambulatory Visit: Payer: Self-pay | Admitting: Adult Health

## 2021-11-17 LAB — CERVICOVAGINAL ANCILLARY ONLY
Bacterial Vaginitis (gardnerella): POSITIVE — AB
Candida Glabrata: NEGATIVE
Candida Vaginitis: NEGATIVE
Chlamydia: NEGATIVE
Comment: NEGATIVE
Comment: NEGATIVE
Comment: NEGATIVE
Comment: NEGATIVE
Comment: NEGATIVE
Comment: NORMAL
Neisseria Gonorrhea: NEGATIVE
Trichomonas: NEGATIVE

## 2021-11-17 MED ORDER — METRONIDAZOLE 500 MG PO TABS: 500.0000 mg | ORAL_TABLET | Freq: Two times a day (BID) | ORAL | 0 refills | Status: DC

## 2021-11-17 NOTE — Progress Notes (Signed)
+  BV on vaginal swab, will rx flagyl, no alcohol or sex during treatment

## 2021-11-18 ENCOUNTER — Telehealth: Payer: Self-pay

## 2021-11-18 NOTE — Telephone Encounter (Signed)
Left message @ 4:19 pm letting pt know Jenn sent pt a Mychart message regarding results. If pt has further questions, advised to call office. JSY

## 2021-11-18 NOTE — Telephone Encounter (Signed)
Pt aware + for BV. Med was sent to pharmacy. No sex or alcohol while on med. Pt voiced understanding. JSY

## 2021-11-18 NOTE — Telephone Encounter (Signed)
Pt called and wanted to know her test results

## 2021-11-23 ENCOUNTER — Telehealth: Payer: Self-pay | Admitting: Adult Health

## 2021-11-23 NOTE — Telephone Encounter (Signed)
Patient states medicine is making her eyes blood shot red. She said she was taking the whole dosage for the day at the same time. Please advise.

## 2021-11-23 NOTE — Telephone Encounter (Signed)
Pt has been taking Flagyl for 2 days. She has been taking both pills at the same time instead of BID. She has noticed redness in her eyes. I advised to take pills BID instead of at the same time and see if that makes a difference. Pt voiced understanding. JSY

## 2021-12-08 ENCOUNTER — Other Ambulatory Visit: Payer: Medicaid Other

## 2021-12-09 ENCOUNTER — Other Ambulatory Visit (HOSPITAL_COMMUNITY)
Admission: RE | Admit: 2021-12-09 | Discharge: 2021-12-09 | Disposition: A | Discharge status: Home / Self Care | Payer: Medicaid Other | Source: Ambulatory Visit | Attending: Obstetrics & Gynecology | Admitting: Obstetrics & Gynecology

## 2021-12-09 ENCOUNTER — Other Ambulatory Visit (INDEPENDENT_AMBULATORY_CARE_PROVIDER_SITE_OTHER): Payer: Medicaid Other

## 2021-12-09 ENCOUNTER — Other Ambulatory Visit: Payer: Self-pay

## 2021-12-09 DIAGNOSIS — Z8742 Personal history of other diseases of the female genital tract: Secondary | ICD-10-CM | POA: Diagnosis present

## 2021-12-09 DIAGNOSIS — Z113 Encounter for screening for infections with a predominantly sexual mode of transmission: Secondary | ICD-10-CM | POA: Insufficient documentation

## 2021-12-09 NOTE — Progress Notes (Signed)
° °  NURSE VISIT- VAGINITIS/STD/POC  SUBJECTIVE:  Brandy Farrell is a 31 y.o. G1P1001 GYN patientfemale here for a vaginal swab for vaginitis screening, STD screen..  She reports the following symptoms: none for 0 days. Denies abnormal vaginal bleeding, significant pelvic pain, fever, or UTI symptoms.  OBJECTIVE:  There were no vitals taken for this visit.  Appears well, in no apparent distress  ASSESSMENT: Vaginal swab for vaginitis screening/std screen  PLAN: Self-collected vaginal probe for Gonorrhea, Chlamydia, Trichomonas, Bacterial Vaginosis, Yeast sent to lab Treatment: to be determined once results are received Follow-up as needed if symptoms persist/worsen, or new symptoms develop  Annamarie Dawley  12/09/2021 3:26 PM

## 2021-12-13 LAB — CERVICOVAGINAL ANCILLARY ONLY
Bacterial Vaginitis (gardnerella): NEGATIVE
Candida Glabrata: NEGATIVE
Candida Vaginitis: NEGATIVE
Chlamydia: NEGATIVE
Comment: NEGATIVE
Comment: NEGATIVE
Comment: NEGATIVE
Comment: NEGATIVE
Comment: NEGATIVE
Comment: NORMAL
Neisseria Gonorrhea: NEGATIVE
Trichomonas: NEGATIVE

## 2021-12-14 ENCOUNTER — Telehealth: Payer: Self-pay

## 2021-12-14 NOTE — Telephone Encounter (Signed)
Pt is calling wanting test results.  She does not have my chart.

## 2021-12-14 NOTE — Telephone Encounter (Signed)
Returned patient's call. Informed swab was negative.  Pt verbalized gratitude with no further questions.

## 2021-12-29 ENCOUNTER — Encounter: Payer: Self-pay | Admitting: Women's Health

## 2021-12-29 ENCOUNTER — Other Ambulatory Visit: Payer: Self-pay

## 2021-12-29 ENCOUNTER — Ambulatory Visit (INDEPENDENT_AMBULATORY_CARE_PROVIDER_SITE_OTHER): Payer: Medicaid Other | Admitting: Women's Health

## 2021-12-29 VITALS — BP 130/86 | HR 53 | Ht 67.0 in | Wt 178.0 lb

## 2021-12-29 DIAGNOSIS — Z30018 Encounter for initial prescription of other contraceptives: Secondary | ICD-10-CM

## 2021-12-29 DIAGNOSIS — Z30432 Encounter for removal of intrauterine contraceptive device: Secondary | ICD-10-CM | POA: Diagnosis not present

## 2021-12-29 MED ORDER — PHEXXI 1.8-1-0.4 % VA GEL
1.0000 | Freq: Once | VAGINAL | 11 refills | Status: AC
Start: 2021-12-29 — End: 2021-12-29

## 2021-12-29 NOTE — Progress Notes (Signed)
? ?  IUD REMOVAL  ?Patient name: Brandy Farrell MRN [id number]  Date of birth: 1991-08-07 ?Subjective Findings:   ?Brandy Farrell is a 31 y.o. G55P1001 African American female being seen today for removal of a Liletta  IUD. Her IUD was placed 08/13/14.  She desires removal because 'she wants to go natural'. Signed copy of informed consent in chart.  ?No LMP recorded (lmp unknown). (Menstrual status: IUD). ?Last pap 09/14/21. Results were: NILM w/ HRHPV negative ?The planned method of family planning is discussed options, wants to try Phexxi ? ?Depression screen [facility] 2/9 09/14/2021 01/09/2018  ?Decreased Interest 0 1  ?Down, Depressed, Hopeless 0 -  ?PHQ - 2 Score 0 1  ?Altered sleeping 0 1  ?Tired, decreased energy 0 1  ?Change in appetite 0 3  ?Feeling bad or failure about yourself  0 -  ?Trouble concentrating 0 0  ?Moving slowly or fidgety/restless 0 0  ?Suicidal thoughts 0 0  ?PHQ-9 Score 0 6  ?Difficult doing work/chores - Somewhat difficult  ? ?  ?GAD 7 : Generalized Anxiety Score 09/14/2021  ?Nervous, Anxious, on Edge 0  ?Control/stop worrying 0  ?Worry too much - different things 0  ?Trouble relaxing 0  ?Restless 0  ?Easily annoyed or irritable 0  ?Afraid - awful might happen 0  ?Total GAD 7 Score 0  ? ? ? ?Pertinent History Reviewed:   ?Reviewed past medical,surgical, social, obstetrical and family history.  ?Reviewed problem list, medications and allergies. ?Objective Findings & Procedure:   ? ?Vitals:  ? 12/29/21 1356  ?BP: 130/86  ?Pulse: (!) 53  ?Weight: 178 lb (80.7 kg)  ?Height: 5\' 7"  (1.702 m)  ?Body mass index is 27.88 kg/m?. ? ?No results found for this or any previous visit (from the past 24 hour(s)).  ? ?Time out was performed. ? ?A graves speculum was placed in the vagina.  The cervix was visualized, and the strings were visible. They were grasped and the Liletta  IUD was easily removed intact without complications. The patient tolerated the procedure well.  ? ?Chaperone: Angel Neas    ?Assessment & Plan:   ?1) Liletta  IUD removal ?Follow-up prn problems ? ?2) Contraception management> rx Phexxi to , 1 sample box given, can use w/ condoms ? ?No orders of the defined types were placed in this encounter. ? ? ?Follow-up: Return in about 1 year (around 12/30/2022) for Physical. ? ?01/01/2023 CNM, WHNP-BC ?12/29/2021 ?2:13 PM  ?

## 2022-04-22 ENCOUNTER — Other Ambulatory Visit: Payer: Self-pay | Admitting: Adult Health

## 2022-04-22 ENCOUNTER — Ambulatory Visit (INDEPENDENT_AMBULATORY_CARE_PROVIDER_SITE_OTHER): Payer: Medicaid Other | Admitting: *Deleted

## 2022-04-22 DIAGNOSIS — Z3201 Encounter for pregnancy test, result positive: Secondary | ICD-10-CM | POA: Diagnosis not present

## 2022-04-22 LAB — POCT URINE PREGNANCY: Preg Test, Ur: POSITIVE — AB

## 2022-04-22 MED ORDER — PRENATAL PLUS 27-1 MG PO TABS: 1.0000 | ORAL_TABLET | Freq: Every day | ORAL | 12 refills | Status: DC

## 2022-04-22 NOTE — Progress Notes (Signed)
Rx pNV 

## 2022-04-22 NOTE — Progress Notes (Signed)
   NURSE VISIT- PREGNANCY CONFIRMATION   SUBJECTIVE:  Brandy Farrell is a 31 y.o. G2P1001 female at [redacted]w[redacted]d by certain LMP of Patient's last menstrual period was 03/11/2022 (exact date). Here for pregnancy confirmation.  Home pregnancy test: positive x 2   She reports no complaints.  She is not taking prenatal vitamins.    OBJECTIVE:  LMP 03/11/2022 (Exact Date)   Appears well, in no apparent distress  Results for orders placed or performed in visit on 04/22/22 (from the past 24 hour(s))  POCT urine pregnancy   Collection Time: 04/22/22 10:56 AM  Result Value Ref Range   Preg Test, Ur Positive (A) Negative    ASSESSMENT: Positive pregnancy test, [redacted]w[redacted]d by LMP    PLAN: Schedule for dating ultrasound in 2 weeks Prenatal vitamins: note routed to [facility] to send prescription   Nausea medicines: not currently needed   OB packet given: Yes  Annamarie Dawley  04/22/2022 10:57 AM

## 2022-04-27 ENCOUNTER — Encounter: Payer: Self-pay | Admitting: Obstetrics & Gynecology

## 2022-05-06 ENCOUNTER — Other Ambulatory Visit: Payer: Self-pay | Admitting: Obstetrics & Gynecology

## 2022-05-06 DIAGNOSIS — O3680X0 Pregnancy with inconclusive fetal viability, not applicable or unspecified: Secondary | ICD-10-CM

## 2022-05-09 ENCOUNTER — Ambulatory Visit: Payer: Medicaid Other

## 2022-05-24 ENCOUNTER — Ambulatory Visit (INDEPENDENT_AMBULATORY_CARE_PROVIDER_SITE_OTHER): Payer: Medicaid Other

## 2022-05-24 ENCOUNTER — Other Ambulatory Visit: Payer: Self-pay | Admitting: Obstetrics & Gynecology

## 2022-05-24 DIAGNOSIS — Z3A14 14 weeks gestation of pregnancy: Secondary | ICD-10-CM

## 2022-05-24 DIAGNOSIS — O3680X0 Pregnancy with inconclusive fetal viability, not applicable or unspecified: Secondary | ICD-10-CM

## 2022-05-24 DIAGNOSIS — O30041 Twin pregnancy, dichorionic/diamniotic, first trimester: Secondary | ICD-10-CM | POA: Diagnosis not present

## 2022-05-24 NOTE — Progress Notes (Signed)
[place] DC/DA twins:10+4 wks,normal ovaries BABY A:inferior,posterior placenta,CRL 46.94 mm,FHR 174 bpm BABY B:superior,anterior placenta,CRL 45.50 mm,FHR 177 bpm

## 2022-05-25 ENCOUNTER — Other Ambulatory Visit: Payer: Medicaid Other

## 2022-06-01 ENCOUNTER — Encounter: Payer: Self-pay | Admitting: Advanced Practice Midwife

## 2022-06-01 DIAGNOSIS — O0990 Supervision of high risk pregnancy, unspecified, unspecified trimester: Secondary | ICD-10-CM | POA: Insufficient documentation

## 2022-06-02 ENCOUNTER — Encounter: Payer: Self-pay | Admitting: Obstetrics & Gynecology

## 2022-06-02 ENCOUNTER — Other Ambulatory Visit: Payer: Self-pay | Admitting: Obstetrics & Gynecology

## 2022-06-02 DIAGNOSIS — O30041 Twin pregnancy, dichorionic/diamniotic, first trimester: Secondary | ICD-10-CM

## 2022-06-02 DIAGNOSIS — O30049 Twin pregnancy, dichorionic/diamniotic, unspecified trimester: Secondary | ICD-10-CM | POA: Insufficient documentation

## 2022-06-02 DIAGNOSIS — Z3682 Encounter for antenatal screening for nuchal translucency: Secondary | ICD-10-CM

## 2022-06-02 DIAGNOSIS — O30009 Twin pregnancy, unspecified number of placenta and unspecified number of amniotic sacs, unspecified trimester: Secondary | ICD-10-CM | POA: Insufficient documentation

## 2022-06-03 ENCOUNTER — Ambulatory Visit (INDEPENDENT_AMBULATORY_CARE_PROVIDER_SITE_OTHER): Payer: Medicaid Other

## 2022-06-03 ENCOUNTER — Encounter: Payer: Self-pay | Admitting: Advanced Practice Midwife

## 2022-06-03 ENCOUNTER — Ambulatory Visit (INDEPENDENT_AMBULATORY_CARE_PROVIDER_SITE_OTHER): Payer: Medicaid Other | Admitting: Advanced Practice Midwife

## 2022-06-03 ENCOUNTER — Ambulatory Visit: Payer: Medicaid Other | Admitting: *Deleted

## 2022-06-03 VITALS — BP 110/70 | HR 72 | Wt 203.0 lb

## 2022-06-03 DIAGNOSIS — O30041 Twin pregnancy, dichorionic/diamniotic, first trimester: Secondary | ICD-10-CM | POA: Diagnosis not present

## 2022-06-03 DIAGNOSIS — O0992 Supervision of high risk pregnancy, unspecified, second trimester: Secondary | ICD-10-CM

## 2022-06-03 DIAGNOSIS — O0991 Supervision of high risk pregnancy, unspecified, first trimester: Secondary | ICD-10-CM

## 2022-06-03 DIAGNOSIS — O99320 Drug use complicating pregnancy, unspecified trimester: Secondary | ICD-10-CM

## 2022-06-03 DIAGNOSIS — Z3A12 12 weeks gestation of pregnancy: Secondary | ICD-10-CM

## 2022-06-03 DIAGNOSIS — Z3A13 13 weeks gestation of pregnancy: Secondary | ICD-10-CM | POA: Diagnosis not present

## 2022-06-03 DIAGNOSIS — R768 Other specified abnormal immunological findings in serum: Secondary | ICD-10-CM

## 2022-06-03 DIAGNOSIS — F1120 Opioid dependence, uncomplicated: Secondary | ICD-10-CM | POA: Insufficient documentation

## 2022-06-03 DIAGNOSIS — Z3682 Encounter for antenatal screening for nuchal translucency: Secondary | ICD-10-CM | POA: Diagnosis not present

## 2022-06-03 MED ORDER — ASPIRIN 81 MG PO CHEW: 162.0000 mg | CHEWABLE_TABLET | Freq: Every day | ORAL | 7 refills | Status: DC

## 2022-06-03 MED ORDER — BLOOD PRESSURE MONITOR MISC: 0 refills | Status: AC

## 2022-06-03 NOTE — Patient Instructions (Signed)
Brandy Farrell, thank you for choosing our office today! We appreciate the opportunity to meet your healthcare needs. You may receive a short survey by mail, e-mail, or through MyChart. If you are happy with your care we would appreciate if you could take just a few minutes to complete the survey questions. We read all of your comments and take your feedback very seriously. Thank you again for choosing our office.  [facility] ([address] [place], [place] [address]) Entrance C, located off of [address] Free 24/7 valet parking   Nausea & Vomiting Have saltine crackers or pretzels by your bed and eat a few bites before you raise your head out of bed in the morning Eat small frequent meals throughout the day instead of large meals Drink plenty of fluids throughout the day to stay hydrated, just don't drink a lot of fluids with your meals.  This can make your stomach fill up faster making you feel sick Do not brush your teeth right after you eat Products with real ginger are good for nausea, like ginger ale and ginger hard candy Make sure it says made with real ginger! Sucking on sour candy like lemon heads is also good for nausea If your prenatal vitamins make you nauseated, take them at night so you will sleep through the nausea Sea Bands If you feel like you need medicine for the nausea & vomiting please let us know If you are unable to keep any fluids or food down please let us know   Constipation Drink plenty of fluid, preferably water, throughout the day Eat foods high in fiber such as fruits, vegetables, and grains Exercise, such as walking, is a good way to keep your bowels regular Drink warm fluids, especially warm prune juice, or decaf coffee Eat a 1/2 cup of real oatmeal (not instant), 1/2 cup applesauce, and 1/2-1 cup warm prune juice every day If needed, you may take Colace (docusate sodium) stool softener  once or twice a day to help keep the stool soft.  If you still are having problems with constipation, you may take Miralax once daily as needed to help keep your bowels regular.   Home Blood Pressure Monitoring for Patients   Your provider has recommended that you check your blood pressure (BP) at least once a week at home. If you do not have a blood pressure cuff at home, one will be provided for you. Contact your provider if you have not received your monitor within 1 week.   Helpful Tips for Accurate Home Blood Pressure Checks  Don't smoke, exercise, or drink caffeine 30 minutes before checking your BP Use the restroom before checking your BP (a full bladder can raise your pressure) Relax in a comfortable upright chair Feet on the ground Left arm resting comfortably on a flat surface at the level of your heart Legs uncrossed Back supported Sit quietly and don't talk Place the cuff on your bare arm Adjust snuggly, so that only two fingertips can fit between your skin and the top of the cuff Check 2 readings separated by at least one minute Keep a log of your BP readings For a visual, please reference this diagram: http://ccnc.care/bpdiagram  Provider Name: [facility]     Phone: [phone]  Zone 1: ALL CLEAR  Continue to monitor your symptoms:  BP reading is less than 140 (top number) or less than 90 (bottom   number)  No right upper stomach pain No headaches or seeing spots No feeling nauseated or throwing up No swelling in face and hands  Zone 2: CAUTION Call your doctor's office for any of the following:  BP reading is greater than 140 (top number) or greater than 90 (bottom number)  Stomach pain under your ribs in the middle or right side Headaches or seeing spots Feeling nauseated or throwing up Swelling in face and hands  Zone 3: EMERGENCY  Seek immediate medical care if you have any of the following:  BP reading is greater than160 (top number) or greater than  110 (bottom number) Severe headaches not improving with Tylenol Serious difficulty catching your breath Any worsening symptoms from Zone 2    First Trimester of Pregnancy The first trimester of pregnancy is from week 1 until the end of week 12 (months 1 through 3). A week after a sperm fertilizes an egg, the egg will implant on the wall of the uterus. This embryo will begin to develop into a baby. Genes from you and your partner are forming the baby. The female genes determine whether the baby is a boy or a girl. At 6-8 weeks, the eyes and face are formed, and the heartbeat can be seen on ultrasound. At the end of 12 weeks, all the baby's organs are formed.  Now that you are pregnant, you will want to do everything you can to have a healthy baby. Two of the most important things are to get good prenatal care and to follow your health care provider's instructions. Prenatal care is all the medical care you receive before the baby's birth. This care will help prevent, find, and treat any problems during the pregnancy and childbirth. BODY CHANGES Your body goes through many changes during pregnancy. The changes vary from woman to woman.  You may gain or lose a couple of pounds at first. You may feel sick to your stomach (nauseous) and throw up (vomit). If the vomiting is uncontrollable, call your health care provider. You may tire easily. You may develop headaches that can be relieved by medicines approved by your health care provider. You may urinate more often. Painful urination may mean you have a bladder infection. You may develop heartburn as a result of your pregnancy. You may develop constipation because certain hormones are causing the muscles that push waste through your intestines to slow down. You may develop hemorrhoids or swollen, bulging veins (varicose veins). Your breasts may begin to grow larger and become tender. Your nipples may stick out more, and the tissue that surrounds them  (areola) may become darker. Your gums may bleed and may be sensitive to brushing and flossing. Dark spots or blotches (chloasma, mask of pregnancy) may develop on your face. This will likely fade after the baby is born. Your menstrual periods will stop. You may have a loss of appetite. You may develop cravings for certain kinds of food. You may have changes in your emotions from day to day, such as being excited to be pregnant or being concerned that something may go wrong with the pregnancy and baby. You may have more vivid and strange dreams. You may have changes in your hair. These can include thickening of your hair, rapid growth, and changes in texture. Some women also have hair loss during or after pregnancy, or hair that feels dry or thin. Your hair will most likely return to normal after your baby is born. WHAT TO EXPECT AT YOUR PRENATAL  VISITS During a routine prenatal visit: You will be weighed to make sure you and the baby are growing normally. Your blood pressure will be taken. Your abdomen will be measured to track your baby's growth. The fetal heartbeat will be listened to starting around week 10 or 12 of your pregnancy. Test results from any previous visits will be discussed. Your health care provider may ask you: How you are feeling. If you are feeling the baby move. If you have had any abnormal symptoms, such as leaking fluid, bleeding, severe headaches, or abdominal cramping. If you have any questions. Other tests that may be performed during your first trimester include: Blood tests to find your blood type and to check for the presence of any previous infections. They will also be used to check for low iron levels (anemia) and Rh antibodies. Later in the pregnancy, blood tests for diabetes will be done along with other tests if problems develop. Urine tests to check for infections, diabetes, or protein in the urine. An ultrasound to confirm the proper growth and development  of the baby. An amniocentesis to check for possible genetic problems. Fetal screens for spina bifida and Down syndrome. You may need other tests to make sure you and the baby are doing well. HOME CARE INSTRUCTIONS  Medicines Follow your health care provider's instructions regarding medicine use. Specific medicines may be either safe or unsafe to take during pregnancy. Take your prenatal vitamins as directed. If you develop constipation, try taking a stool softener if your health care provider approves. Diet Eat regular, well-balanced meals. Choose a variety of foods, such as meat or vegetable-based protein, fish, milk and low-fat dairy products, vegetables, fruits, and whole grain breads and cereals. Your health care provider will help you determine the amount of weight gain that is right for you. Avoid raw meat and uncooked cheese. These carry germs that can cause birth defects in the baby. Eating four or five small meals rather than three large meals a day may help relieve nausea and vomiting. If you start to feel nauseous, eating a few soda crackers can be helpful. Drinking liquids between meals instead of during meals also seems to help nausea and vomiting. If you develop constipation, eat more high-fiber foods, such as fresh vegetables or fruit and whole grains. Drink enough fluids to keep your urine clear or pale yellow. Activity and Exercise Exercise only as directed by your health care provider. Exercising will help you: Control your weight. Stay in shape. Be prepared for labor and delivery. Experiencing pain or cramping in the lower abdomen or low back is a good sign that you should stop exercising. Check with your health care provider before continuing normal exercises. Try to avoid standing for long periods of time. Move your legs often if you must stand in one place for a long time. Avoid heavy lifting. Wear low-heeled shoes, and practice good posture. You may continue to have sex  unless your health care provider directs you otherwise. Relief of Pain or Discomfort Wear a good support bra for breast tenderness.   Take warm sitz baths to soothe any pain or discomfort caused by hemorrhoids. Use hemorrhoid cream if your health care provider approves.   Rest with your legs elevated if you have leg cramps or low back pain. If you develop varicose veins in your legs, wear support hose. Elevate your feet for 15 minutes, 3-4 times a day. Limit salt in your diet. Prenatal Care Schedule your prenatal visits by the  twelfth week of pregnancy. They are usually scheduled monthly at first, then more often in the last 2 months before delivery. Write down your questions. Take them to your prenatal visits. Keep all your prenatal visits as directed by your health care provider. Safety Wear your seat belt at all times when driving. Make a list of emergency phone numbers, including numbers for family, friends, the hospital, and police and fire departments. General Tips Ask your health care provider for a referral to a local prenatal education class. Begin classes no later than at the beginning of month 6 of your pregnancy. Ask for help if you have counseling or nutritional needs during pregnancy. Your health care provider can offer advice or refer you to specialists for help with various needs. Do not use hot tubs, steam rooms, or saunas. Do not douche or use tampons or scented sanitary pads. Do not cross your legs for long periods of time. Avoid cat litter boxes and soil used by cats. These carry germs that can cause birth defects in the baby and possibly loss of the fetus by miscarriage or stillbirth. Avoid all smoking, herbs, alcohol, and medicines not prescribed by your health care provider. Chemicals in these affect the formation and growth of the baby. Schedule a dentist appointment. At home, brush your teeth with a soft toothbrush and be gentle when you floss. SEEK MEDICAL CARE IF:   You have dizziness. You have mild pelvic cramps, pelvic pressure, or nagging pain in the abdominal area. You have persistent nausea, vomiting, or diarrhea. You have a bad smelling vaginal discharge. You have pain with urination. You notice increased swelling in your face, hands, legs, or ankles. SEEK IMMEDIATE MEDICAL CARE IF:  You have a fever. You are leaking fluid from your vagina. You have spotting or bleeding from your vagina. You have severe abdominal cramping or pain. You have rapid weight gain or loss. You vomit blood or material that looks like coffee grounds. You are exposed to [place] measles and have never had them. You are exposed to fifth disease or chickenpox. You develop a severe headache. You have shortness of breath. You have any kind of trauma, such as from a fall or a car accident. Document Released: 09/27/2001 Document Revised: 02/17/2014 Document Reviewed: 08/13/2013 [facility] Patient Information 2015 [place], [place]. This information is not intended to replace advice given to you by your health care provider. Make sure you discuss any questions you have with your health care provider.

## 2022-06-03 NOTE — Progress Notes (Addendum)
[place] DC/DA twins,12 wks,normal ovaries BABY A: inferior,posterior placenta,NB present,NT 1.1 mm,FHR 159 bpm,CRL 61.83 mm BABY B:superior,anterior placenta,NB present,NT 1.2 mm,FHR 162 bpm,CRL 55.52 mm

## 2022-06-03 NOTE — Progress Notes (Signed)
INITIAL OBSTETRICAL VISIT Patient name: Brandy Farrell MRN [id number]  Date of birth: 01-Mar-1991 Chief Complaint:   Initial Prenatal Visit  History of Present Illness:   Brandy Farrell is a 31 y.o. G63P1001  female at [redacted]w[redacted]d by LMP c/w u/s at 10.4 weeks with an Estimated Date of Delivery: 12/16/22 being seen today for her initial obstetrical visit.   Patient's last menstrual period was 03/11/2022 (exact date). Her obstetrical history is significant for  SVB 2008 without complication at [facility] .   Today she reports fatigue.  Last pap Nov 2022. Results were: NILM w/ HRHPV negative     06/03/2022   10:20 AM 06/03/2022   10:08 AM 09/14/2021    9:14 AM 01/09/2018    3:21 PM  Depression screen PHQ 2/9  Decreased Interest 1 1 0 1  Down, Depressed, Hopeless 0 0 0   PHQ - 2 Score 1 1 0 1  Altered sleeping 2 2 0 1  Tired, decreased energy 2 2 0 1  Change in appetite 0 0 0 3  Feeling bad or failure about yourself  0 0 0   Trouble concentrating 0 0 0 0  Moving slowly or fidgety/restless 0 0 0 0  Suicidal thoughts 0 0 0 0  PHQ-9 Score 5 5 0 6  Difficult doing work/chores    Somewhat difficult        06/03/2022   10:24 AM 06/03/2022   10:08 AM 09/14/2021    9:14 AM  GAD 7 : Generalized Anxiety Score  Nervous, Anxious, on Edge 0 0 0  Control/stop worrying 0 0 0  Worry too much - different things 0 0 0  Trouble relaxing 0 0 0  Restless 0 0 0  Easily annoyed or irritable 0 0 0  Afraid - awful might happen 0 0 0  Total GAD 7 Score 0 0 0     Review of Systems:   Pertinent items are noted in HPI Denies cramping/contractions, leakage of fluid, vaginal bleeding, abnormal vaginal discharge w/ itching/odor/irritation, headaches, visual changes, shortness of breath, chest pain, abdominal pain, severe nausea/vomiting, or problems with urination or bowel movements unless otherwise stated above.  Pertinent History Reviewed:  Reviewed past medical,surgical, social, obstetrical and family  history.  Reviewed problem list, medications and allergies. OB History  Gravida Para Term Preterm AB Living  2 1 1     1   SAB IAB Ectopic Multiple Live Births          1    # Outcome Date GA Lbr Len/2nd Weight Sex Delivery Anes PTL Lv  2 Current           1 Term 04/06/07 [redacted]w[redacted]d  6 lb 14 oz (3.118 kg) M Vag-Spont EPI N LIV   Physical Assessment:   Vitals:   06/03/22 0942  BP: 110/70  Pulse: 72  Weight: 203 lb (92.1 kg)  Body mass index is 31.79 kg/m.       Physical Examination:  General appearance - well appearing, and in no distress  Mental status - alert, oriented to person, place, and time  Psych:  She has a normal mood and affect  Skin - warm and dry, normal color, no suspicious lesions noted  Chest - effort normal, all lung fields clear to auscultation bilaterally  Heart - normal rate and regular rhythm  Abdomen - soft, nontender  Extremities:  No swelling or varicosities noted  Pelvic - not indicated  Thin prep pap is not  done    TODAY'S NT [place] DC/DA twins,12 wks,normal ovaries BABY A: inferior,posterior placenta,NB present,NT 1.1 mm,FHR 159 bpm,CRL 61.83 mm BABY B:superior,anterior placenta,NB present,NT 1.2 mm,FHR 162 bpm,CRL 55.52 mm  No results found for this or any previous visit (from the past 24 hour(s)).  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    Dispense:  60 tablet    Refill:  7    Order Specific Question:   Supervising Provider    Answer:   H [[id number]]    Initial labs obtained Continue prenatal vitamins Reviewed n/v relief measures and warning s/s to report Reviewed recommended weight gain based on pre-gravid BMI Encouraged well-balanced diet Genetic & carrier screening discussed: requests Panorama, NT/IT, and Horizon , declines AFP Ultrasound discussed; fetal survey: requested CCNC completed> form faxed if has or is planning to apply for medicaid The nature of [place] [facility] with multiple MDs and other Advanced Practice Providers was explained to patient; also emphasized that fellows, residents, and students are part of our team. Does not have home bp cuff. Office bp cuff given: no. Rx sent: yes. Check bp weekly, let [facility] know if consistently >140/90.   Indications for ASA therapy (per uptodate) One of the following: Multifetal gestation Yes  Indications for early A1C (per uptodate) BMI >=25 (>=23 in Asian women) AND one of the following High-risk race/ethnicity (eg, African American, Latino, Native American, [place] American, Pacific Islander) Yes   Follow-up: No follow-ups on file.   Orders Placed This Encounter  Procedures   GC/Chlamydia Probe Amp   Urine Culture   Integrated 1   CBC/D/Plt+RPR+Rh+ABO+RubIgG...   HORIZON CUSTOM   Panorama Prenatal Test Full Panel   HgB A1c   POC Urinalysis Dipstick OB    [employer] [facility] 06/03/2022 10:36 AM

## 2022-06-04 LAB — HEMOGLOBIN A1C
Est. average glucose Bld gHb Est-mCnc: 88 mg/dL
Hgb A1c MFr Bld: 4.7 % — ABNORMAL LOW (ref 4.8–5.6)

## 2022-06-09 LAB — CBC/D/PLT+RPR+RH+ABO+RUBIGG...
Antibody Screen: NEGATIVE
Basophils Absolute: 0 10*3/uL (ref 0.0–0.2)
Basos: 0 %
EOS (ABSOLUTE): 0.3 10*3/uL (ref 0.0–0.4)
Eos: 3 %
HCV Ab: NONREACTIVE
HIV Screen 4th Generation wRfx: NONREACTIVE
Hematocrit: 34 % (ref 34.0–46.6)
Hemoglobin: 11.3 g/dL (ref 11.1–15.9)
Hepatitis B Surface Ag: NEGATIVE
Immature Grans (Abs): 0.1 10*3/uL (ref 0.0–0.1)
Immature Granulocytes: 1 %
Lymphocytes Absolute: 2.2 10*3/uL (ref 0.7–3.1)
Lymphs: 23 %
MCH: 31.1 pg (ref 26.6–33.0)
MCHC: 33.2 g/dL (ref 31.5–35.7)
MCV: 94 fL (ref 79–97)
Monocytes Absolute: 0.6 10*3/uL (ref 0.1–0.9)
Monocytes: 7 %
Neutrophils Absolute: 6.2 10*3/uL (ref 1.4–7.0)
Neutrophils: 66 %
Platelets: 285 10*3/uL (ref 150–450)
RBC: 3.63 x10E6/uL — ABNORMAL LOW (ref 3.77–5.28)
RDW: 11.2 % — ABNORMAL LOW (ref 11.7–15.4)
RPR Ser Ql: NONREACTIVE
Rh Factor: POSITIVE
Rubella Antibodies, IGG: 6.4 index (ref >0.99)
WBC: 9.3 10*3/uL (ref 3.4–10.8)

## 2022-06-09 LAB — INTEGRATED 1
Crown Rump Length Twin B: 55.5 mm
Crown Rump Length: 61.8 mm
Gest. Age on Collection Date: 12.4 weeks
Maternal Age at EDD: 33.7 yr
NT Twin B: 1.2 mm
Nuchal Translucency (NT): 1.1 mm
Number of Fetuses: 2
PAPP-A Value: 1325.7 ng/mL
Weight: 203 [lb_av]

## 2022-06-09 LAB — PANORAMA PRENATAL TEST FULL PANEL:PANORAMA TEST PLUS 5 ADDITIONAL MICRODELETIONS: FETAL FRACTION: 9.5

## 2022-06-09 LAB — HCV INTERPRETATION

## 2022-06-17 LAB — HORIZON CUSTOM: REPORT SUMMARY: NEGATIVE

## 2022-07-01 ENCOUNTER — Encounter: Payer: Self-pay | Admitting: Obstetrics & Gynecology

## 2022-07-01 ENCOUNTER — Ambulatory Visit (INDEPENDENT_AMBULATORY_CARE_PROVIDER_SITE_OTHER): Payer: Medicaid Other | Admitting: Obstetrics & Gynecology

## 2022-07-01 VITALS — BP 100/57 | HR 82 | Wt 210.0 lb

## 2022-07-01 DIAGNOSIS — O30042 Twin pregnancy, dichorionic/diamniotic, second trimester: Secondary | ICD-10-CM

## 2022-07-01 DIAGNOSIS — Z3A16 16 weeks gestation of pregnancy: Secondary | ICD-10-CM

## 2022-07-01 DIAGNOSIS — F1190 Opioid use, unspecified, uncomplicated: Secondary | ICD-10-CM

## 2022-07-01 DIAGNOSIS — Z1379 Encounter for other screening for genetic and chromosomal anomalies: Secondary | ICD-10-CM

## 2022-07-01 NOTE — Progress Notes (Signed)
HIGH-RISK PREGNANCY VISIT Patient name: Brandy Farrell MRN [id number]  Date of birth: December 09, 1990 Chief Complaint:   Routine Prenatal Visit  History of Present Illness:   Brandy Farrell is a 31 y.o. G15P1001 female at [redacted]w[redacted]d with an Estimated Date of Delivery: 12/16/22 being seen today for ongoing management of a high-risk pregnancy complicated by:  DC/DA twins OUD- followed by outside office  Today she reports no complaints.   Contractions: Not present. Vag. Bleeding: None.  Noting lots of flutters . denies leaking of fluid.      06/03/2022   10:20 AM 06/03/2022   10:08 AM 09/14/2021    9:14 AM 01/09/2018    3:21 PM  Depression screen PHQ 2/9  Decreased Interest 1 1 0 1  Down, Depressed, Hopeless 0 0 0   PHQ - 2 Score 1 1 0 1  Altered sleeping 2 2 0 1  Tired, decreased energy 2 2 0 1  Change in appetite 0 0 0 3  Feeling bad or failure about yourself  0 0 0   Trouble concentrating 0 0 0 0  Moving slowly or fidgety/restless 0 0 0 0  Suicidal thoughts 0 0 0 0  PHQ-9 Score 5 5 0 6  Difficult doing work/chores    Somewhat difficult     Current Outpatient Medications  Medication Instructions   aspirin 162 mg, Oral, Daily   Blood Pressure Monitor MISC For regular home bp monitoring during pregnancy   buprenorphine (SUBUTEX) 8 mg, Sublingual, 2 times daily   prenatal vitamin w/FE, FA (PRENATAL 1 + 1) 27-1 MG TABS tablet 1 tablet, Oral, Daily     Review of Systems:   Pertinent items are noted in HPI Denies abnormal vaginal discharge w/ itching/odor/irritation, headaches, visual changes, shortness of breath, chest pain, abdominal pain, severe nausea/vomiting, or problems with urination or bowel movements unless otherwise stated above. Pertinent History Reviewed:  Reviewed past medical,surgical, social, obstetrical and family history.  Reviewed problem list, medications and allergies. Physical Assessment:   Vitals:   07/01/22 1000  BP: (!) 100/57  Pulse: 82  Weight:  210 lb (95.3 kg)  Body mass index is 32.89 kg/m.           Physical Examination:   General appearance: alert, well appearing, and in no distress  Mental status: normal mood, behavior, speech, dress, motor activity, and thought processes  Skin: warm & dry   Extremities: Edema: None    Cardiovascular: normal heart rate noted  Respiratory: normal respiratory effort, no distress  Abdomen: gravid, soft, non-tender  Pelvic: Cervical exam deferred         Fetal Status: Fetal Heart Rate (bpm): 155/150        Fetal Surveillance Testing today: doppler   Chaperone: N/A    No results found for this or any previous visit (from the past 24 hour(s)).   Assessment & Plan:  High-risk pregnancy: G2P1001 at [redacted]w[redacted]d with an Estimated Date of Delivery: 12/16/22   1) DC/DA twins Continue growth q 4wks  2) OUD -outside office, doing well with current dose -Toxassure at visits []  NAS referral in the future  Meds: No orders of the defined types were placed in this encounter.   Labs/procedures today: IT-2  Treatment Plan:  as outlined above  Reviewed: Preterm labor symptoms and general obstetric precautions including but not limited to vaginal bleeding, contractions, leaking of fluid and fetal movement were reviewed in detail with the patient.  All questions were answered.  Follow-up: Return in about 4 weeks (around 07/29/2022) for HROB visit (ok CNM), anatomy scan- twins.   Future Appointments  Date Time Provider Department Center  07/14/2022  9:15 AM [facility] [place] [facility] None  07/14/2022 10:50 AM [place], Amaryllis Dyke, MD [facility]    Orders Placed This Encounter  Procedures   INTEGRATED 2    Myna Hidalgo, DO Attending Obstetrician & Gynecologist, [facility] for [facility]

## 2022-07-06 LAB — TOXASSURE SELECT 13 (MW), URINE

## 2022-07-14 ENCOUNTER — Encounter: Payer: Medicaid Other | Admitting: Obstetrics & Gynecology

## 2022-07-14 ENCOUNTER — Other Ambulatory Visit: Payer: Medicaid Other

## 2022-07-28 ENCOUNTER — Other Ambulatory Visit: Payer: Self-pay | Admitting: Obstetrics & Gynecology

## 2022-07-28 DIAGNOSIS — O30042 Twin pregnancy, dichorionic/diamniotic, second trimester: Secondary | ICD-10-CM

## 2022-07-28 DIAGNOSIS — Z363 Encounter for antenatal screening for malformations: Secondary | ICD-10-CM

## 2022-08-01 ENCOUNTER — Encounter: Payer: Self-pay | Admitting: Women's Health

## 2022-08-01 ENCOUNTER — Ambulatory Visit (INDEPENDENT_AMBULATORY_CARE_PROVIDER_SITE_OTHER): Payer: Medicaid Other

## 2022-08-01 ENCOUNTER — Ambulatory Visit (INDEPENDENT_AMBULATORY_CARE_PROVIDER_SITE_OTHER): Payer: Medicaid Other | Admitting: Women's Health

## 2022-08-01 VITALS — BP 100/57 | HR 80 | Wt 215.4 lb

## 2022-08-01 DIAGNOSIS — Z3A20 20 weeks gestation of pregnancy: Secondary | ICD-10-CM

## 2022-08-01 DIAGNOSIS — Z23 Encounter for immunization: Secondary | ICD-10-CM

## 2022-08-01 DIAGNOSIS — O30042 Twin pregnancy, dichorionic/diamniotic, second trimester: Secondary | ICD-10-CM | POA: Diagnosis not present

## 2022-08-01 DIAGNOSIS — F1120 Opioid dependence, uncomplicated: Secondary | ICD-10-CM

## 2022-08-01 DIAGNOSIS — Z363 Encounter for antenatal screening for malformations: Secondary | ICD-10-CM

## 2022-08-01 DIAGNOSIS — O0992 Supervision of high risk pregnancy, unspecified, second trimester: Secondary | ICD-10-CM

## 2022-08-01 DIAGNOSIS — O0993 Supervision of high risk pregnancy, unspecified, third trimester: Secondary | ICD-10-CM

## 2022-08-01 NOTE — Progress Notes (Signed)
[place] DC/DA RKYHC:62+3 wks,CX 3.2 cm,normal ovaries BABY A:cephalic left,posterior placenta gr 0,FHR 146 bpm,SVP of fluid 4.8 cm,EFW 419 g,anatomy complete,no obvious abnormalities  BABY B:cephalic right,anterior placenta gr 0,FHR 142 bpm,SVP of fluid 6 cm,EFW 412 g,discordance 1.7%,anatomy complete,no obvious abnormalities

## 2022-08-01 NOTE — Progress Notes (Signed)
HIGH-RISK PREGNANCY VISIT Patient name: Brandy Farrell MRN [id number]  Date of birth: 12-28-1990 Chief Complaint:   Routine Prenatal Visit ([place] today)  History of Present Illness:   Brandy Farrell is a 31 y.o. G55P1001 female at [redacted]w[redacted]d with an Estimated Date of Delivery: 12/16/22 being seen today for ongoing management of a high-risk pregnancy complicated by multiple gestation DCDA twins, subutex therapy.    Today she reports no complaints. Currently on subutex 8mg  breakfast/lunch and 4mg  evening. Contractions: Not present. Vag. Bleeding: None.  Movement: Present. denies leaking of fluid.      06/03/2022   10:20 AM 06/03/2022   10:08 AM 09/14/2021    9:14 AM 01/09/2018    3:21 PM  Depression screen PHQ 2/9  Decreased Interest 1 1 0 1  Down, Depressed, Hopeless 0 0 0   PHQ - 2 Score 1 1 0 1  Altered sleeping 2 2 0 1  Tired, decreased energy 2 2 0 1  Change in appetite 0 0 0 3  Feeling bad or failure about yourself  0 0 0   Trouble concentrating 0 0 0 0  Moving slowly or fidgety/restless 0 0 0 0  Suicidal thoughts 0 0 0 0  PHQ-9 Score 5 5 0 6  Difficult doing work/chores    Somewhat difficult        06/03/2022   10:24 AM 06/03/2022   10:08 AM 09/14/2021    9:14 AM  GAD 7 : Generalized Anxiety Score  Nervous, Anxious, on Edge 0 0 0  Control/stop worrying 0 0 0  Worry too much - different things 0 0 0  Trouble relaxing 0 0 0  Restless 0 0 0  Easily annoyed or irritable 0 0 0  Afraid - awful might happen 0 0 0  Total GAD 7 Score 0 0 0     Review of Systems:   Pertinent items are noted in HPI Denies abnormal vaginal discharge w/ itching/odor/irritation, headaches, visual changes, shortness of breath, chest pain, abdominal pain, severe nausea/vomiting, or problems with urination or bowel movements unless otherwise stated above. Pertinent History Reviewed:  Reviewed past medical,surgical, social, obstetrical and family history.  Reviewed problem list, medications and  allergies. Physical Assessment:   Vitals:   08/01/22 1344  BP: (!) 100/57  Pulse: 80  Weight: 215 lb 6.4 oz (97.7 kg)  Body mass index is 33.74 kg/m.           Physical Examination:   General appearance: alert, well appearing, and in no distress  Mental status: alert, oriented to person, place, and time  Skin: warm & dry   Extremities: Edema: None    Cardiovascular: normal heart rate noted  Respiratory: normal respiratory effort, no distress  Abdomen: gravid, soft, non-tender  Pelvic: Cervical exam deferred         Fetal Status: Fetal Heart Rate (bpm): 146/142   Movement: Present    Fetal Surveillance Testing today: [place] DC/DA [id number] wks,CX 3.2 cm,normal ovaries BABY A:cephalic left,posterior placenta gr 0,FHR 146 bpm,SVP of fluid 4.8 cm,EFW 419 g,anatomy complete,no obvious abnormalities  BABY B:cephalic right,anterior placenta gr 0,FHR 142 bpm,SVP of fluid 6 cm,EFW 412 g,discordance 1.7%,anatomy complete,no obvious abnormalities     Chaperone: N/A    No results found for this or any previous visit (from the past 24 hour(s)).  Assessment & Plan:  High-risk pregnancy: G2P1001 at [redacted]w[redacted]d with an Estimated Date of Delivery: 12/16/22   1) DCDA twins, stable, 1.7% discordance today  2)  Subutex therapy, stable, currently on 8mg  breakfast and lunch, 4mg  evening by Dr. Webb Silversmith   Meds: No orders of the defined types were placed in this encounter.   Labs/procedures today: U/S, flu shot, 2nd IT (didn't do last time), urine cx and gc/ct (not processed at new ob)  Treatment Plan:  ASA [ ]      U/S 24, 28, 32, 36wks    2x/wk nst or weekly bpp @ 36wks    Deliver @ 38wks:____   Reviewed: Preterm labor symptoms and general obstetric precautions including but not limited to vaginal bleeding, contractions, leaking of fluid and fetal movement were reviewed in detail with the patient.  All questions were answered. Does have home bp cuff. Office bp cuff given: not applicable. Check bp weekly, let  us know if consistently >140 and/or >90.  Follow-up: Return in about 4 weeks (around 08/29/2022) for HROB, Twins, US:EFW, MD or CNM, in person.   No future appointments.  Orders Placed This Encounter  Procedures   Urine Culture   GC/Chlamydia Probe Amp   Flu Vaccine QUAD 36mo+IM (Fluarix, Fluzone & Alfiuria Quad PF)   Roma Schanz CNM, [facility] 08/01/2022 2:21 PM

## 2022-08-01 NOTE — Patient Instructions (Signed)
Brandy Farrell, thank you for choosing our office today! We appreciate the opportunity to meet your healthcare needs. You may receive a short survey by mail, e-mail, or through [employer]. If you are happy with your care we would appreciate if you could take just a few minutes to complete the survey questions. We read all of your comments and take your feedback very seriously. Thank you again for choosing our office.  Center for [employer] Team at [place] [facility] ([place], [place] [address]) Entrance C, located off of [place] parking  Go to [email or web address] to register for FREE online childbirth classes  Call the office [phone]) or go to [facility] if: You begin to severe cramping Your water breaks.  Sometimes it is a big gush of fluid, sometimes it is just a trickle that keeps getting your panties wet or running down your legs You have vaginal bleeding.  It is normal to have a small amount of spotting if your cervix was checked.   [facility] Pediatricians/Family Doctors [facility]): [address] [place], [facility]: [address] [place], [phone]                [facility]): [place], [phone] (call to ask if accepting patients) [facility] Department: [address], [place], [place] Pediatricians/Family Doctors [facility]): [place]. [place], [address], [place] [facility]: [address] [place], Lisbeth Puller [facility]: [place], [place] [facility]): [phone] [facility]: [address] [place]: [address] [place] [facility]: [phone], [phone]  Home Blood Pressure Monitoring for Patients   Your provider has recommended that you check your blood pressure (BP) at least once a week at home. If you do not have a blood pressure cuff at home, one will be provided for you. Contact your provider if you have not received your monitor within 1 week.   Helpful Tips for Accurate Home Blood Pressure Checks  Don't smoke, exercise, or drink caffeine 30 minutes before checking your BP Use the restroom before checking your BP (a full bladder can raise your pressure) Relax in a comfortable upright chair Feet on the ground Left arm resting comfortably on a flat surface at the level of your heart Legs uncrossed Back supported Sit quietly and don't talk Place the cuff on your bare arm Adjust snuggly, so that only two fingertips can fit between your skin and the top of the cuff Check 2 readings separated by at least one minute Keep a log of your BP readings For a visual, please reference this diagram: http://ccnc.care/bpdiagram  Provider Name: [facility]     Phone: [phone]  Zone 1: ALL CLEAR  Continue to monitor your symptoms:  BP reading is less than 140 (top number) or less than 90 (bottom number)  No right upper stomach pain No headaches or seeing spots No feeling nauseated or throwing up No swelling in face and hands  Zone 2: CAUTION Call your doctor's office for any of the following:  BP reading is greater than 140 (top number) or greater than  90 (bottom number)  Stomach pain under your ribs in the middle or right side Headaches or seeing spots Feeling nauseated or throwing up Swelling in face and hands  Zone 3: EMERGENCY  Seek immediate medical care if you have any of the following:  BP reading is greater than160 (top number) or greater than 110 (bottom number) Severe headaches not improving with Tylenol Serious difficulty catching your breath Any worsening symptoms from  Zone 2     Second Trimester of Pregnancy The second trimester is from week 14 through week 27 (months 4 through 6). The second trimester is often a time when you feel your best. Your body has adjusted to being pregnant, and you begin to feel better physically. Usually, morning sickness has lessened or quit completely, you may have more energy, and you may have an increase in appetite. The second trimester is also a time when the fetus is growing rapidly. At the end of the sixth month, the fetus is about 9 inches long and weighs about 1 pounds. You will likely begin to feel the baby move (quickening) between 16 and 20 weeks of pregnancy. Body changes during your second trimester Your body continues to go through many changes during your second trimester. The changes vary from woman to woman. Your weight will continue to increase. You will notice your lower abdomen bulging out. You may begin to get stretch marks on your hips, abdomen, and breasts. You may develop headaches that can be relieved by medicines. The medicines should be approved by your health care provider. You may urinate more often because the fetus is pressing on your bladder. You may develop or continue to have heartburn as a result of your pregnancy. You may develop constipation because certain hormones are causing the muscles that push waste through your intestines to slow down. You may develop hemorrhoids or swollen, bulging veins (varicose veins). You may have back pain. This is caused by: Weight gain. Pregnancy hormones that are relaxing the joints in your pelvis. A shift in weight and the muscles that support your balance. Your breasts will continue to grow and they will continue to become tender. Your gums may bleed and may be sensitive to brushing and flossing. Dark spots or blotches (chloasma, mask of pregnancy) may develop on your face. This will likely fade after the baby is born. A dark line from your belly button to  the pubic area (linea nigra) may appear. This will likely fade after the baby is born. You may have changes in your hair. These can include thickening of your hair, rapid growth, and changes in texture. Some women also have hair loss during or after pregnancy, or hair that feels dry or thin. Your hair will most likely return to normal after your baby is born.  What to expect at prenatal visits During a routine prenatal visit: You will be weighed to make sure you and the fetus are growing normally. Your blood pressure will be taken. Your abdomen will be measured to track your baby's growth. The fetal heartbeat will be listened to. Any test results from the previous visit will be discussed.  Your health care provider may ask you: How you are feeling. If you are feeling the baby move. If you have had any abnormal symptoms, such as leaking fluid, bleeding, severe headaches, or abdominal cramping. If you are using any tobacco products, including cigarettes, chewing tobacco, and electronic cigarettes. If you have any questions.  Other tests that may be performed during   your second trimester include: Blood tests that check for: Low iron levels (anemia). High blood sugar that affects pregnant women (gestational diabetes) between 24 and 28 weeks. Rh antibodies. This is to check for a protein on red blood cells (Rh factor). Urine tests to check for infections, diabetes, or protein in the urine. An ultrasound to confirm the proper growth and development of the baby. An amniocentesis to check for possible genetic problems. Fetal screens for spina bifida and Down syndrome. HIV (human immunodeficiency virus) testing. Routine prenatal testing includes screening for HIV, unless you choose not to have this test.  Follow these instructions at home: Medicines Follow your health care provider's instructions regarding medicine use. Specific medicines may be either safe or unsafe to take during  pregnancy. Take a prenatal vitamin that contains at least 600 micrograms (mcg) of folic acid. If you develop constipation, try taking a stool softener if your health care provider approves. Eating and drinking Eat a balanced diet that includes fresh fruits and vegetables, whole grains, good sources of protein such as meat, eggs, or tofu, and low-fat dairy. Your health care provider will help you determine the amount of weight gain that is right for you. Avoid raw meat and uncooked cheese. These carry germs that can cause birth defects in the baby. If you have low calcium intake from food, talk to your health care provider about whether you should take a daily calcium supplement. Limit foods that are high in fat and processed sugars, such as fried and sweet foods. To prevent constipation: Drink enough fluid to keep your urine clear or pale yellow. Eat foods that are high in fiber, such as fresh fruits and vegetables, whole grains, and beans. Activity Exercise only as directed by your health care provider. Most women can continue their usual exercise routine during pregnancy. Try to exercise for 30 minutes at least 5 days a week. Stop exercising if you experience uterine contractions. Avoid heavy lifting, wear low heel shoes, and practice good posture. A sexual relationship may be continued unless your health care provider directs you otherwise. Relieving pain and discomfort Wear a good support bra to prevent discomfort from breast tenderness. Take warm sitz baths to soothe any pain or discomfort caused by hemorrhoids. Use hemorrhoid cream if your health care provider approves. Rest with your legs elevated if you have leg cramps or low back pain. If you develop varicose veins, wear support hose. Elevate your feet for 15 minutes, 3-4 times a day. Limit salt in your diet. Prenatal Care Write down your questions. Take them to your prenatal visits. Keep all your prenatal visits as told by your health  care provider. This is important. Safety Wear your seat belt at all times when driving. Make a list of emergency phone numbers, including numbers for family, friends, the hospital, and police and fire departments. General instructions Ask your health care provider for a referral to a local prenatal education class. Begin classes no later than the beginning of month 6 of your pregnancy. Ask for help if you have counseling or nutritional needs during pregnancy. Your health care provider can offer advice or refer you to specialists for help with various needs. Do not use hot tubs, steam rooms, or saunas. Do not douche or use tampons or scented sanitary pads. Do not cross your legs for long periods of time. Avoid cat litter boxes and soil used by cats. These carry germs that can cause birth defects in the baby and possibly loss of the   fetus by miscarriage or stillbirth. Avoid all smoking, herbs, alcohol, and unprescribed drugs. Chemicals in these products can affect the formation and growth of the baby. Do not use any products that contain nicotine or tobacco, such as cigarettes and e-cigarettes. If you need help quitting, ask your health care provider. Visit your dentist if you have not gone yet during your pregnancy. Use a soft toothbrush to brush your teeth and be gentle when you floss. Contact a health care provider if: You have dizziness. You have mild pelvic cramps, pelvic pressure, or nagging pain in the abdominal area. You have persistent nausea, vomiting, or diarrhea. You have a bad smelling vaginal discharge. You have pain when you urinate. Get help right away if: You have a fever. You are leaking fluid from your vagina. You have spotting or bleeding from your vagina. You have severe abdominal cramping or pain. You have rapid weight gain or weight loss. You have shortness of breath with chest pain. You notice sudden or extreme swelling of your face, hands, ankles, feet, or legs. You  have not felt your baby move in over an hour. You have severe headaches that do not go away when you take medicine. You have vision changes. Summary The second trimester is from week 14 through week 27 (months 4 through 6). It is also a time when the fetus is growing rapidly. Your body goes through many changes during pregnancy. The changes vary from woman to woman. Avoid all smoking, herbs, alcohol, and unprescribed drugs. These chemicals affect the formation and growth your baby. Do not use any tobacco products, such as cigarettes, chewing tobacco, and e-cigarettes. If you need help quitting, ask your health care provider. Contact your health care provider if you have any questions. Keep all prenatal visits as told by your health care provider. This is important. This information is not intended to replace advice given to you by your health care provider. Make sure you discuss any questions you have with your health care provider. Document Released: 09/27/2001 Document Revised: 03/10/2016 Document Reviewed: 12/04/2012 Elsevier Interactive Patient Education  2017 [employer]  

## 2022-08-03 LAB — INTEGRATED 2
AFP MoM: 3.05
Alpha-Fetoprotein: 147.3 ng/mL
Crown Rump Length Twin B: 55.5 mm
Crown Rump Length: 61.8 mm
DIA MoM: 1.3
DIA Value: 218.7 pg/mL
Estriol, Unconjugated: 2.21 ng/mL
Gest. Age on Collection Date: 12.4 wk
Gestational Age: 20.9 wk
Maternal Age at EDD: 33.7 a
NT MoM Twin B: 0.97
NT Twin B: 1.2 mm
Nuchal Translucency (NT): 1.1 mm
Nuchal Translucency MoM: 0.8
Number of Fetuses: 2
PAPP-A MoM: 1.93
PAPP-A Value: 1325.7 ng/mL
Weight: 203 [lb_av]
Weight: 203 [lb_av]
hCG MoM: 3.73
hCG Value: 68.3 [IU]/mL
uE3 MoM: 0.92

## 2022-08-03 LAB — URINE CULTURE: Organism ID, Bacteria: NO GROWTH

## 2022-08-03 LAB — GC/CHLAMYDIA PROBE AMP
Chlamydia trachomatis, NAA: NEGATIVE
Neisseria Gonorrhoeae by PCR: NEGATIVE

## 2022-08-18 ENCOUNTER — Encounter: Payer: Self-pay | Admitting: Women's Health

## 2022-08-19 ENCOUNTER — Encounter: Payer: Self-pay | Admitting: Advanced Practice Midwife

## 2022-08-29 ENCOUNTER — Encounter: Payer: Self-pay | Admitting: Women's Health

## 2022-08-29 ENCOUNTER — Other Ambulatory Visit: Payer: Self-pay | Admitting: Women's Health

## 2022-08-29 ENCOUNTER — Ambulatory Visit (INDEPENDENT_AMBULATORY_CARE_PROVIDER_SITE_OTHER): Payer: Medicaid Other

## 2022-08-29 ENCOUNTER — Ambulatory Visit (INDEPENDENT_AMBULATORY_CARE_PROVIDER_SITE_OTHER): Payer: Medicaid Other | Admitting: Women's Health

## 2022-08-29 VITALS — BP 111/56 | HR 79 | Wt 214.5 lb

## 2022-08-29 DIAGNOSIS — O30042 Twin pregnancy, dichorionic/diamniotic, second trimester: Secondary | ICD-10-CM

## 2022-08-29 DIAGNOSIS — O99320 Drug use complicating pregnancy, unspecified trimester: Secondary | ICD-10-CM

## 2022-08-29 DIAGNOSIS — O0992 Supervision of high risk pregnancy, unspecified, second trimester: Secondary | ICD-10-CM

## 2022-08-29 DIAGNOSIS — Z3A24 24 weeks gestation of pregnancy: Secondary | ICD-10-CM

## 2022-08-29 DIAGNOSIS — F1120 Opioid dependence, uncomplicated: Secondary | ICD-10-CM

## 2022-08-29 LAB — POCT URINALYSIS DIPSTICK OB
Blood, UA: NEGATIVE
Glucose, UA: NEGATIVE
Ketones, UA: NEGATIVE
Leukocytes, UA: NEGATIVE
Nitrite, UA: NEGATIVE

## 2022-08-29 NOTE — Progress Notes (Signed)
[place] DC/DA TWINS:normal ovaries,cx 2.8 cm, BABY A:cephalic left,posterior placenta gr 0,SVP of fluid 3.4 cm,FHR 142 bpm,EFW 796 g 79%,discordance 4.5% BABY B: cephalic right,anterior placenta gr 0,SVP of fluid 3.7 cm,FHR 137 bpm,EFW 834 g 89%

## 2022-08-29 NOTE — Progress Notes (Signed)
HIGH-RISK PREGNANCY VISIT Patient name: Brandy Farrell MRN [id number]  Date of birth: 09-Mar-1991 Chief Complaint:   High Risk Gestation ([place] today)  History of Present Illness:   Brandy Farrell is a 31 y.o. G104P1001 female at [redacted]w[redacted]d with an Estimated Date of Delivery: 12/16/22 being seen today for ongoing management of a high-risk pregnancy complicated by multiple gestation DCDA twins, subutex therapy.    Today she reports  LUQ pain, sent note via mychart w/ Drenda Freeze, sounded like costochondritis, still a little sore at times. . Provider increased her subutex to 8mg  TID.  Contractions: Not present. Vag. Bleeding: None.  Movement: Present. denies leaking of fluid.      06/03/2022   10:20 AM 06/03/2022   10:08 AM 09/14/2021    9:14 AM 01/09/2018    3:21 PM  Depression screen PHQ 2/9  Decreased Interest 1 1 0 1  Down, Depressed, Hopeless 0 0 0   PHQ - 2 Score 1 1 0 1  Altered sleeping 2 2 0 1  Tired, decreased energy 2 2 0 1  Change in appetite 0 0 0 3  Feeling bad or failure about yourself  0 0 0   Trouble concentrating 0 0 0 0  Moving slowly or fidgety/restless 0 0 0 0  Suicidal thoughts 0 0 0 0  PHQ-9 Score 5 5 0 6  Difficult doing work/chores    Somewhat difficult        06/03/2022   10:24 AM 06/03/2022   10:08 AM 09/14/2021    9:14 AM  GAD 7 : Generalized Anxiety Score  Nervous, Anxious, on Edge 0 0 0  Control/stop worrying 0 0 0  Worry too much - different things 0 0 0  Trouble relaxing 0 0 0  Restless 0 0 0  Easily annoyed or irritable 0 0 0  Afraid - awful might happen 0 0 0  Total GAD 7 Score 0 0 0     Review of Systems:   Pertinent items are noted in HPI Denies abnormal vaginal discharge w/ itching/odor/irritation, headaches, visual changes, shortness of breath, chest pain, abdominal pain, severe nausea/vomiting, or problems with urination or bowel movements unless otherwise stated above. Pertinent History Reviewed:  Reviewed past medical,surgical, social,  obstetrical and family history.  Reviewed problem list, medications and allergies. Physical Assessment:   Vitals:   08/29/22 1540  BP: (!) 111/56  Pulse: 79  Weight: 214 lb 8 oz (97.3 kg)  Body mass index is 33.6 kg/m.           Physical Examination:   General appearance: alert, well appearing, and in no distress  Mental status: alert, oriented to person, place, and time  Skin: warm & dry   Extremities:      Cardiovascular: normal heart rate noted  Respiratory: normal respiratory effort, no distress  Abdomen: gravid, soft, non-tender  Pelvic: Cervical exam deferred         Fetal Status: Fetal Heart Rate (bpm): 142/137 u/s   Movement: Present    Fetal Surveillance Testing today: 08/31/22 DC/DA TWINS:normal ovaries,cx 2.8 cm, BABY A:cephalic left,posterior placenta gr 0,SVP of fluid 3.4 cm,FHR 142 bpm,EFW 796 g 79%,discordance 4.5% BABY B: cephalic right,anterior placenta gr 0,SVP of fluid 3.7 cm,FHR 137 bpm,EFW 834 g 89%    Chaperone: N/A    Results for orders placed or performed in visit on 08/29/22 (from the past 24 hour(s))  POC Urinalysis Dipstick OB   Collection Time: 08/29/22  3:45 PM  Result  Value Ref Range   Color, UA     Clarity, UA     Glucose, UA Negative Negative   Bilirubin, UA     Ketones, UA neg    Spec Grav, UA     Blood, UA neg    pH, UA     POC,PROTEIN,UA Small (1+) Negative, Trace, Small (1+), Moderate (2+), Large (3+), 4+   Urobilinogen, UA     Nitrite, UA neg    Leukocytes, UA Negative Negative   Appearance     Odor      Assessment & Plan:  High-risk pregnancy: G2P1001 at [redacted]w[redacted]d with an Estimated Date of Delivery: 12/16/22   1) DCDA twins, stable, discordance 4.5% today  2) Subutex therapy, recent increase to 8mg  TID, discussed NAS referral and ordered  Meds: No orders of the defined types were placed in this encounter.   Labs/procedures today: U/S  Treatment Plan:   U/S q4wks    2x/wk nst or weekly bpp @ 36wks    Deliver @ 38wks:____    Reviewed: Preterm labor symptoms and general obstetric precautions including but not limited to vaginal bleeding, contractions, leaking of fluid and fetal movement were reviewed in detail with the patient.  All questions were answered. Does have home bp cuff. Office bp cuff given: not applicable. Check bp weekly, let know if consistently >140 and/or >90.  Follow-up: Return for HROB, PN2, Twins, US:EFW, MD or CNM, in person.   Future Appointments  Date Time Provider Department Center  09/26/2022  8:30 AM [facility]  09/26/2022  2:15 PM [facility] 14/08/2022 [facility] None  09/26/2022  3:50 PM 14/08/2022, CNM [facility]  10/24/2022  2:15 PM [facility] 12/23/2022 [facility] None  10/24/2022  4:10 PM 12/23/2022, CNM [facility]  11/07/2022  3:50 PM 11/09/2022, MD [facility]  11/17/2022  3:30 PM [facility] NURSE [facility]  11/21/2022  2:15 PM [facility] 01/20/2023 [facility] None  11/21/2022  3:50 PM 01/20/2023, MD [facility]  11/24/2022  3:30 PM [facility] NURSE [facility]  11/28/2022  3:30 PM [facility] NURSE [facility]  11/28/2022  3:50 PM 01/27/2023, MD [facility]  12/01/2022  3:30 PM [facility] NURSE [facility]  12/05/2022  2:15 PM [facility] 12/07/2022 [facility] None    Orders Placed This Encounter  Procedures   US OB Follow Up   US OB Follow Up AddL Gest   Amb referral to Neonatal Abstinence Syndrome (NAS) Team   POC Urinalysis Dipstick OB   US CNM, [facility] 08/29/2022 4:15 PM

## 2022-08-29 NOTE — Patient Instructions (Signed)
Brandy Farrell, thank you for choosing our office today! We appreciate the opportunity to meet your healthcare needs. You may receive a short survey by mail, e-mail, or through [employer]. If you are happy with your care we would appreciate if you could take just a few minutes to complete the survey questions. We read all of your comments and take your feedback very seriously. Thank you again for choosing our office.  Center for [employer] Team at [facility]  [facility] at [facility] ([address] [place], [place] [address]) Entrance C, located off of [address] Free 24/7 valet parking   You will have your sugar test next visit.  Please do not eat or drink anything after midnight the night before you come, not even water.  You will be here for at least two hours.  Please make an appointment online for the bloodwork at [email or web address] for 8:00am (or as close to this as possible). Make sure you select the [facility] service center.   CLASSES: Go to [email or web address] to register for classes (childbirth, breastfeeding, waterbirth, infant CPR, daddy bootcamp, etc.)  Call the office [phone]) or go to [facility] if: You begin to have strong, frequent contractions Your water breaks.  Sometimes it is a big gush of fluid, sometimes it is just a trickle that keeps getting your panties wet or running down your legs You have vaginal bleeding.  It is normal to have a small amount of spotting if your cervix was checked.  You don't feel your baby moving like normal.  If you don't, get you something to eat and drink and lay down and focus on feeling your baby move.   If your baby is still not moving like normal, you should call the office or go to [facility].  Call the office [phone]) or go to [facility] hospital for these signs of pre-eclampsia: Severe headache that does not go away with Tylenol Visual changes- seeing spots, double, blurred vision Pain under your right breast or upper  abdomen that does not go away with Tums or heartburn medicine Nausea and/or vomiting Severe swelling in your hands, feet, and face    [facility] Pediatricians/Family Doctors [facility]): [address] Colette Ribas, [phone]           [facility]: [address], [phone]                [facility]): [address], [phone] (call to ask if accepting patients) [facility] Department: [address], [place], [phone]    [facility] Pediatricians/Family Doctors [facility]): [address], [phone] [facility]: [address], [phone] [facility]: [address], [phone]  [facility] Doctors  [facility]): [phone] [facility]: [address] [phone]   [facility] Doctors [facility]: [address], [phone]  [facility] Doctors  [facility]: [phone], [phone]  Home Blood Pressure Monitoring for Patients   Your provider has recommended that you check your blood pressure (BP) at least once a week at home. If you do not have a blood pressure cuff at home, one will be provided for you. Contact your provider if you have not received your monitor within 1 week.   Helpful Tips for Accurate Home Blood Pressure Checks  Don't smoke, exercise, or drink  caffeine 30 minutes before checking your BP Use the restroom before checking your BP (a full bladder can raise your pressure) Relax in a comfortable upright chair Feet on the ground Left arm resting comfortably on a flat surface at the level of your heart Legs uncrossed Back supported Sit quietly and don't talk Place the cuff on your bare arm Adjust snuggly, so that only two fingertips can fit between your skin and the top of the cuff Check 2  readings separated by at least one minute Keep a log of your BP readings For a visual, please reference this diagram: http://ccnc.care/bpdiagram  Provider Name: [facility]     Phone: [phone]  Zone 1: ALL CLEAR  Continue to monitor your symptoms:  BP reading is less than 140 (top number) or less than 90 (bottom number)  No right upper stomach pain No headaches or seeing spots No feeling nauseated or throwing up No swelling in face and hands  Zone 2: CAUTION Call your doctor's office for any of the following:  BP reading is greater than 140 (top number) or greater than 90 (bottom number)  Stomach pain under your ribs in the middle or right side Headaches or seeing spots Feeling nauseated or throwing up Swelling in face and hands  Zone 3: EMERGENCY  Seek immediate medical care if you have any of the following:  BP reading is greater than160 (top number) or greater than 110 (bottom number) Severe headaches not improving with Tylenol Serious difficulty catching your breath Any worsening symptoms from Zone 2   Second Trimester of Pregnancy The second trimester is from week 13 through week 28, months 4 through 6. The second trimester is often a time when you feel your best. Your body has also adjusted to being pregnant, and you begin to feel better physically. Usually, morning sickness has lessened or quit completely, you may have more energy, and you may have an increase in appetite. The second trimester is also a time when the fetus is growing rapidly. At the end of the sixth month, the fetus is about 9 inches long and weighs about 1 pounds. You will likely begin to feel the baby move (quickening) between 18 and 20 weeks of the pregnancy. BODY CHANGES Your body goes through many changes during pregnancy. The changes vary from woman to woman.  Your weight will continue to increase. You will notice your lower abdomen bulging out. You may begin to get stretch marks on your  hips, abdomen, and breasts. You may develop headaches that can be relieved by medicines approved by your health care provider. You may urinate more often because the fetus is pressing on your bladder. You may develop or continue to have heartburn as a result of your pregnancy. You may develop constipation because certain hormones are causing the muscles that push waste through your intestines to slow down. You may develop hemorrhoids or swollen, bulging veins (varicose veins). You may have back pain because of the weight gain and pregnancy hormones relaxing your joints between the bones in your pelvis and as a result of a shift in weight and the muscles that support your balance. Your breasts will continue to grow and be tender. Your gums may bleed and may be sensitive to brushing and flossing. Dark spots or blotches (chloasma, mask of pregnancy) may develop on your face. This will likely fade after the baby is born. A dark line from your belly button to the pubic area (linea nigra) may appear. This  will likely fade after the baby is born. You may have changes in your hair. These can include thickening of your hair, rapid growth, and changes in texture. Some women also have hair loss during or after pregnancy, or hair that feels dry or thin. Your hair will most likely return to normal after your baby is born. WHAT TO EXPECT AT YOUR PRENATAL VISITS During a routine prenatal visit: You will be weighed to make sure you and the fetus are growing normally. Your blood pressure will be taken. Your abdomen will be measured to track your baby's growth. The fetal heartbeat will be listened to. Any test results from the previous visit will be discussed. Your health care provider may ask you: How you are feeling. If you are feeling the baby move. If you have had any abnormal symptoms, such as leaking fluid, bleeding, severe headaches, or abdominal cramping. If you have any questions. Other tests that may  be performed during your second trimester include: Blood tests that check for: Low iron levels (anemia). Gestational diabetes (between 24 and 28 weeks). Rh antibodies. Urine tests to check for infections, diabetes, or protein in the urine. An ultrasound to confirm the proper growth and development of the baby. An amniocentesis to check for possible genetic problems. Fetal screens for spina bifida and Down syndrome. HOME CARE INSTRUCTIONS  Avoid all smoking, herbs, alcohol, and unprescribed drugs. These chemicals affect the formation and growth of the baby. Follow your health care provider's instructions regarding medicine use. There are medicines that are either safe or unsafe to take during pregnancy. Exercise only as directed by your health care provider. Experiencing uterine cramps is a good sign to stop exercising. Continue to eat regular, healthy meals. Wear a good support bra for breast tenderness. Do not use hot tubs, steam rooms, or saunas. Wear your seat belt at all times when driving. Avoid raw meat, uncooked cheese, cat litter boxes, and soil used by cats. These carry germs that can cause birth defects in the baby. Take your prenatal vitamins. Try taking a stool softener (if your health care provider approves) if you develop constipation. Eat more high-fiber foods, such as fresh vegetables or fruit and whole grains. Drink plenty of fluids to keep your urine clear or pale yellow. Take warm sitz baths to soothe any pain or discomfort caused by hemorrhoids. Use hemorrhoid cream if your health care provider approves. If you develop varicose veins, wear support hose. Elevate your feet for 15 minutes, 3-4 times a day. Limit salt in your diet. Avoid heavy lifting, wear low heel shoes, and practice good posture. Rest with your legs elevated if you have leg cramps or low back pain. Visit your dentist if you have not gone yet during your pregnancy. Use a soft toothbrush to brush your teeth  and be gentle when you floss. A sexual relationship may be continued unless your health care provider directs you otherwise. Continue to go to all your prenatal visits as directed by your health care provider. SEEK MEDICAL CARE IF:  You have dizziness. You have mild pelvic cramps, pelvic pressure, or nagging pain in the abdominal area. You have persistent nausea, vomiting, or diarrhea. You have a bad smelling vaginal discharge. You have pain with urination. SEEK IMMEDIATE MEDICAL CARE IF:  You have a fever. You are leaking fluid from your vagina. You have spotting or bleeding from your vagina. You have severe abdominal cramping or pain. You have rapid weight gain or loss. You have shortness of   breath with chest pain. You notice sudden or extreme swelling of your face, hands, ankles, feet, or legs. You have not felt your baby move in over an hour. You have severe headaches that do not go away with medicine. You have vision changes. Document Released: 09/27/2001 Document Revised: 10/08/2013 Document Reviewed: 12/04/2012 [facility] Patient Information 2015 [place], [place]. This information is not intended to replace advice given to you by your health care provider. Make sure you discuss any questions you have with your health care provider.

## 2022-09-22 ENCOUNTER — Encounter: Payer: Self-pay | Admitting: Obstetrics & Gynecology

## 2022-09-22 DIAGNOSIS — O30042 Twin pregnancy, dichorionic/diamniotic, second trimester: Secondary | ICD-10-CM

## 2022-09-26 ENCOUNTER — Encounter: Payer: Self-pay | Admitting: Women's Health

## 2022-09-26 ENCOUNTER — Ambulatory Visit (INDEPENDENT_AMBULATORY_CARE_PROVIDER_SITE_OTHER): Payer: Medicaid Other

## 2022-09-26 ENCOUNTER — Ambulatory Visit (INDEPENDENT_AMBULATORY_CARE_PROVIDER_SITE_OTHER): Payer: Medicaid Other | Admitting: Women's Health

## 2022-09-26 ENCOUNTER — Other Ambulatory Visit: Payer: Medicaid Other

## 2022-09-26 VITALS — BP 106/62 | HR 83 | Wt 219.0 lb

## 2022-09-26 DIAGNOSIS — Z3A28 28 weeks gestation of pregnancy: Secondary | ICD-10-CM

## 2022-09-26 DIAGNOSIS — O30043 Twin pregnancy, dichorionic/diamniotic, third trimester: Secondary | ICD-10-CM

## 2022-09-26 DIAGNOSIS — F1120 Opioid dependence, uncomplicated: Secondary | ICD-10-CM

## 2022-09-26 DIAGNOSIS — O0993 Supervision of high risk pregnancy, unspecified, third trimester: Secondary | ICD-10-CM

## 2022-09-26 DIAGNOSIS — O0992 Supervision of high risk pregnancy, unspecified, second trimester: Secondary | ICD-10-CM

## 2022-09-26 DIAGNOSIS — Z131 Encounter for screening for diabetes mellitus: Secondary | ICD-10-CM

## 2022-09-26 DIAGNOSIS — O30042 Twin pregnancy, dichorionic/diamniotic, second trimester: Secondary | ICD-10-CM | POA: Diagnosis not present

## 2022-09-26 NOTE — Progress Notes (Signed)
HIGH-RISK PREGNANCY VISIT Patient name: Brandy Farrell MRN [id number]  Date of birth: 09/19/1991 Chief Complaint:   Routine Prenatal Visit  History of Present Illness:   Brandy Farrell is a 31 y.o. G49P1001 female at [redacted]w[redacted]d with an Estimated Date of Delivery: 12/16/22 being seen today for ongoing management of a high-risk pregnancy complicated by multiple gestation DCDA twins.    Today she reports no complaints. Contractions: Not present. Vag. Bleeding: None.  Movement: Present. denies leaking of fluid.      06/03/2022   10:20 AM 06/03/2022   10:08 AM 09/14/2021    9:14 AM 01/09/2018    3:21 PM  Depression screen PHQ 2/9  Decreased Interest 1 1 0 1  Down, Depressed, Hopeless 0 0 0   PHQ - 2 Score 1 1 0 1  Altered sleeping 2 2 0 1  Tired, decreased energy 2 2 0 1  Change in appetite 0 0 0 3  Feeling bad or failure about yourself  0 0 0   Trouble concentrating 0 0 0 0  Moving slowly or fidgety/restless 0 0 0 0  Suicidal thoughts 0 0 0 0  PHQ-9 Score 5 5 0 6  Difficult doing work/chores    Somewhat difficult        06/03/2022   10:24 AM 06/03/2022   10:08 AM 09/14/2021    9:14 AM  GAD 7 : Generalized Anxiety Score  Nervous, Anxious, on Edge 0 0 0  Control/stop worrying 0 0 0  Worry too much - different things 0 0 0  Trouble relaxing 0 0 0  Restless 0 0 0  Easily annoyed or irritable 0 0 0  Afraid - awful might happen 0 0 0  Total GAD 7 Score 0 0 0     Review of Systems:   Pertinent items are noted in HPI Denies abnormal vaginal discharge w/ itching/odor/irritation, headaches, visual changes, shortness of breath, chest pain, abdominal pain, severe nausea/vomiting, or problems with urination or bowel movements unless otherwise stated above. Pertinent History Reviewed:  Reviewed past medical,surgical, social, obstetrical and family history.  Reviewed problem list, medications and allergies. Physical Assessment:   Vitals:   09/26/22 1541  BP: 106/62  Pulse: 83   Weight: 219 lb (99.3 kg)  Body mass index is 34.3 kg/m.           Physical Examination:   General appearance: alert, well appearing, and in no distress  Mental status: alert, oriented to person, place, and time  Skin: warm & dry   Extremities:      Cardiovascular: normal heart rate noted  Respiratory: normal respiratory effort, no distress  Abdomen: gravid, soft, non-tender  Pelvic: Cervical exam deferred         Fetal Status: Fetal Heart Rate (bpm): 124/129 u/s   Movement: Present    Fetal Surveillance Testing today: [place] 28+3 wks,DC/DA twins,normal ovaries BABY A:female,cephalic left,posterior placenta gr 0,SVP of fluid 5.2 cm,FHR 124 bpm,EFW 1285 g 51%,discordance .79% BABY B:female, cephalic right,anterior placenta gr 0,SVP of fluid 5.5 cm,FHR 129 bpm,EFW 1295 g 53%  Chaperone: N/A    No results found for this or any previous visit (from the past 24 hour(s)).  Assessment & Plan:  High-risk pregnancy: G2P1001 at [redacted]w[redacted]d with an Estimated Date of Delivery: 12/16/22   1) DCDA twins, concordant growth today  2) Subutex, stable  Meds: No orders of the defined types were placed in this encounter.   Labs/procedures today:  did not do pn2  this am (car trouble), tdap today  Treatment Plan:    U/S q4wks    2x/wk nst or weekly bpp @ 36wks    Deliver @ 38wks:____   Reviewed: Preterm labor symptoms and general obstetric precautions including but not limited to vaginal bleeding, contractions, leaking of fluid and fetal movement were reviewed in detail with the patient.  All questions were answered. Does have home bp cuff. Office bp cuff given: not applicable. Check bp weekly, let us know if consistently >140 and/or >90.  Follow-up: Return for asap pn2 (no visit), then as scheduled.   Future Appointments  Date Time Provider Department Center  09/28/2022  8:50 AM [facility] LAB [facility]  10/24/2022  2:15 PM [facility] [place] [facility] None  10/24/2022  4:10 PM Cheral Marker, CNM  [facility]  11/07/2022  3:50 PM Jacklyn Shell, CNM [facility]  11/17/2022  3:30 PM [facility] NURSE [facility]  11/21/2022  2:15 PM [facility] [place] [facility] None  11/21/2022  3:50 PM Lazaro Arms, MD [facility]  11/24/2022  3:30 PM [facility] NURSE [facility]  11/28/2022  3:30 PM [facility] NURSE [facility]  11/28/2022  3:50 PM Lazaro Arms, MD [facility]  12/01/2022  3:30 PM [facility] NURSE [facility]  12/05/2022  2:15 PM [facility] [place] [facility] None    Orders Placed This Encounter  Procedures   US OB Follow Up   US OB Follow Up AddL Gest   Tdap vaccine greater than or equal to 7yo IM   Cheral Marker CNM, [facility] 09/26/2022 4:26 PM

## 2022-09-26 NOTE — Progress Notes (Signed)
[place] 28+3 wks,DC/DA twins,normal ovaries BABY A:female,cephalic left,posterior placenta gr 0,SVP of fluid 5.2 cm,FHR 124 bpm,EFW 1285 g 51%,discordance .79% BABY B:female, cephalic right,anterior placenta gr 0,SVP of fluid 5.5 cm,FHR 129 bpm,EFW 1295 g 53%

## 2022-09-26 NOTE — Patient Instructions (Addendum)
Brandy Farrell, thank you for choosing our office today! We appreciate the opportunity to meet your healthcare needs. You may receive a short survey by mail, e-mail, or through [employer]. If you are happy with your care we would appreciate if you could take just a few minutes to complete the survey questions. We read all of your comments and take your feedback very seriously. Thank you again for choosing our office.  Center for [employer] Team at [facility]  [facility] at [facility] ([address] [place], [place] [address]) Entrance C, located off of [address] Free 24/7 valet parking   NICU tour/consult with neonatal nurse-practitioner  [phone]   CLASSES: Go to [email or web address] to register for classes (childbirth, breastfeeding, waterbirth, infant CPR, daddy bootcamp, etc.)  Call the office [phone]) or go to [facility] if: You begin to have strong, frequent contractions Your water breaks.  Sometimes it is a big gush of fluid, sometimes it is just a trickle that keeps getting your panties wet or running down your legs You have vaginal bleeding.  It is normal to have a small amount of spotting if your cervix was checked.  You don't feel your baby moving like normal.  If you don't, get you something to eat and drink and lay down and focus on feeling your baby move.   If your baby is still not moving like normal, you should call the office or go to [facility]  Call the office [phone]) or go to [facility] hospital for these signs of pre-eclampsia: Severe headache that does not go away with Tylenol Visual changes- seeing spots, double, blurred vision Pain under your right breast or upper abdomen that does not go away with Tums or heartburn medicine Nausea and/or vomiting Severe swelling in your hands, feet, and face   Tdap Vaccine It is recommended that you get the Tdap vaccine during the third trimester of EACH pregnancy to help protect your baby from  getting pertussis (whooping cough) 27-36 weeks is the BEST time to do this so that you can pass the protection on to your baby. During pregnancy is better than after pregnancy, but if you are unable to get it during pregnancy it will be offered at the hospital.  You can get this vaccine with [place], at the health department, your family doctor, or some local pharmacies Everyone who will be around your baby should also be up-to-date on their vaccines before the baby comes. Adults (who are not pregnant) only need 1 dose of Tdap during adulthood.   [facility] Pediatricians/Family Doctors [facility]): [address] Colette Ribas, [phone]           [facility]: [address], [phone]                [facility]): [address], [phone] (call to ask if accepting patients) [facility] Department: [address], [place], [phone]    [facility] Pediatricians/Family Doctors [facility]): [phone] [address], [phone] [facility]: [address] [place], [phone] [facility]: [address], [phone]  [facility] Doctors  [facility]): [phone] [facility]: [address], [phone]   [facility] Doctors [facility]: [address] [phone]  [facility] Doctors  [facility]: [phone], [phone]  Home Blood Pressure Monitoring for Patients  Your provider has recommended that you check your blood pressure (BP) at least once a week at home. If you do not have a blood pressure cuff at home, one will be provided for you. Contact your provider if you have not received your monitor within 1 week.   Helpful Tips for Accurate Home Blood Pressure Checks  Don't smoke, exercise, or drink caffeine 30 minutes before checking your BP Use  the restroom before checking your BP (a full bladder can raise your pressure) Relax in a comfortable upright chair Feet on the ground Left arm resting comfortably on a flat surface at the level of your heart Legs uncrossed Back supported Sit quietly and don't talk Place the cuff on your bare arm Adjust snuggly, so that only two fingertips can fit between your skin and the top of the cuff Check 2 readings separated by at least one minute Keep a log of your BP readings For a visual, please reference this diagram: http://ccnc.care/bpdiagram  Provider Name: [facility]     Phone: [phone]  Zone 1: ALL CLEAR  Continue to monitor your symptoms:  BP reading is less than 140 (top number) or less than 90 (bottom number)  No right upper stomach pain No headaches or seeing spots No feeling nauseated or throwing up No swelling in face and hands  Zone 2: CAUTION Call your doctor's office for any of the following:  BP reading is greater than 140 (top number) or greater than 90 (bottom number)  Stomach pain under your ribs in the middle or right side Headaches or seeing spots Feeling nauseated or throwing up Swelling in face and hands  Zone 3: EMERGENCY  Seek immediate medical care if you have any of the following:  BP reading is greater than160 (top number) or greater than 110 (bottom number) Severe headaches not improving with Tylenol Serious difficulty catching your breath Any worsening symptoms from Zone 2   Third Trimester of Pregnancy The third trimester is from week 29 through week 42, months 7 through 9. The third trimester is a time when the fetus is growing rapidly. At the end of the ninth month, the fetus is about 20 inches in length and weighs 6-10 pounds.  BODY CHANGES Your body goes through many changes during pregnancy. The changes vary from woman to woman.  Your weight will continue to increase. You can expect to gain 25-35 pounds (11-16 kg) by the end of the  pregnancy. You may begin to get stretch marks on your hips, abdomen, and breasts. You may urinate more often because the fetus is moving lower into your pelvis and pressing on your bladder. You may develop or continue to have heartburn as a result of your pregnancy. You may develop constipation because certain hormones are causing the muscles that push waste through your intestines to slow down. You may develop hemorrhoids or swollen, bulging veins (varicose veins). You may have pelvic pain because of the weight gain and pregnancy hormones relaxing your joints between the bones in your pelvis. Backaches may result from overexertion of the muscles supporting your posture. You may have changes in your hair. These can include thickening of your hair, rapid growth, and changes in texture. Some women also have hair loss during or after pregnancy, or hair that feels dry or thin. Your hair will most likely return to normal after your baby is born. Your breasts will continue to grow and be tender. A yellow discharge may leak from your breasts called colostrum.  Your belly button may stick out. You may feel short of breath because of your expanding uterus. You may notice the fetus "dropping," or moving lower in your abdomen. You may have a bloody mucus discharge. This usually occurs a few days to a week before labor begins. Your cervix becomes thin and soft (effaced) near your due date. WHAT TO EXPECT AT YOUR PRENATAL EXAMS  You will have prenatal exams every 2 weeks until week 36. Then, you will have weekly prenatal exams. During a routine prenatal visit: You will be weighed to make sure you and the fetus are growing normally. Your blood pressure is taken. Your abdomen will be measured to track your baby's growth. The fetal heartbeat will be listened to. Any test results from the previous visit will be discussed. You may have a cervical check near your due date to see if you have effaced. At around 36  weeks, your caregiver will check your cervix. At the same time, your caregiver will also perform a test on the secretions of the vaginal tissue. This test is to determine if a type of bacteria, Group B streptococcus, is present. Your caregiver will explain this further. Your caregiver may ask you: What your birth plan is. How you are feeling. If you are feeling the baby move. If you have had any abnormal symptoms, such as leaking fluid, bleeding, severe headaches, or abdominal cramping. If you have any questions. Other tests or screenings that may be performed during your third trimester include: Blood tests that check for low iron levels (anemia). Fetal testing to check the health, activity level, and growth of the fetus. Testing is done if you have certain medical conditions or if there are problems during the pregnancy. FALSE LABOR You may feel small, irregular contractions that eventually go away. These are called Braxton Hicks contractions, or false labor. Contractions may last for hours, days, or even weeks before true labor sets in. If contractions come at regular intervals, intensify, or become painful, it is best to be seen by your caregiver.  SIGNS OF LABOR  Menstrual-like cramps. Contractions that are 5 minutes apart or less. Contractions that start on the top of the uterus and spread down to the lower abdomen and back. A sense of increased pelvic pressure or back pain. A watery or bloody mucus discharge that comes from the vagina. If you have any of these signs before the 37th week of pregnancy, call your caregiver right away. You need to go to the hospital to get checked immediately. HOME CARE INSTRUCTIONS  Avoid all smoking, herbs, alcohol, and unprescribed drugs. These chemicals affect the formation and growth of the baby. Follow your caregiver's instructions regarding medicine use. There are medicines that are either safe or unsafe to take during pregnancy. Exercise only as  directed by your caregiver. Experiencing uterine cramps is a good sign to stop exercising. Continue to eat regular, healthy meals. Wear a good support bra for breast tenderness. Do not use hot tubs, steam rooms, or saunas. Wear your seat belt at all times when driving. Avoid raw meat, uncooked cheese, cat litter boxes, and soil used by cats. These carry germs that can cause birth defects in the baby. Take your prenatal vitamins. Try taking a stool softener (if your caregiver approves) if you develop constipation. Eat more high-fiber foods, such as fresh vegetables or fruit and whole grains. Drink plenty of fluids to keep your urine clear or pale yellow. Take warm sitz baths to soothe any pain or  discomfort caused by hemorrhoids. Use hemorrhoid cream if your caregiver approves. If you develop varicose veins, wear support hose. Elevate your feet for 15 minutes, 3-4 times a day. Limit salt in your diet. Avoid heavy lifting, wear low heal shoes, and practice good posture. Rest a lot with your legs elevated if you have leg cramps or low back pain. Visit your dentist if you have not gone during your pregnancy. Use a soft toothbrush to brush your teeth and be gentle when you floss. A sexual relationship may be continued unless your caregiver directs you otherwise. Do not travel far distances unless it is absolutely necessary and only with the approval of your caregiver. Take prenatal classes to understand, practice, and ask questions about the labor and delivery. Make a trial run to the hospital. Pack your hospital bag. Prepare the baby's nursery. Continue to go to all your prenatal visits as directed by your caregiver. SEEK MEDICAL CARE IF: You are unsure if you are in labor or if your water has broken. You have dizziness. You have mild pelvic cramps, pelvic pressure, or nagging pain in your abdominal area. You have persistent nausea, vomiting, or diarrhea. You have a bad smelling vaginal  discharge. You have pain with urination. SEEK IMMEDIATE MEDICAL CARE IF:  You have a fever. You are leaking fluid from your vagina. You have spotting or bleeding from your vagina. You have severe abdominal cramping or pain. You have rapid weight loss or gain. You have shortness of breath with chest pain. You notice sudden or extreme swelling of your face, hands, ankles, feet, or legs. You have not felt your baby move in over an hour. You have severe headaches that do not go away with medicine. You have vision changes. Document Released: 09/27/2001 Document Revised: 10/08/2013 Document Reviewed: 12/04/2012 [facility] Patient Information 2015 [place], [place]. This information is not intended to replace advice given to you by your health care provider. Make sure you discuss any questions you have with your health care provider.

## 2022-09-28 ENCOUNTER — Other Ambulatory Visit: Payer: Medicaid Other

## 2022-09-30 ENCOUNTER — Other Ambulatory Visit: Payer: Medicaid Other

## 2022-10-01 ENCOUNTER — Emergency Department (HOSPITAL_COMMUNITY)
Admission: EM | Admit: 2022-10-01 | Discharge: 2022-10-01 | Disposition: A | Discharge status: Home / Self Care | Payer: Medicaid Other | Attending: Emergency Medicine | Admitting: Emergency Medicine

## 2022-10-01 ENCOUNTER — Encounter (HOSPITAL_COMMUNITY): Payer: Self-pay

## 2022-10-01 ENCOUNTER — Other Ambulatory Visit: Payer: Self-pay

## 2022-10-01 ENCOUNTER — Emergency Department (HOSPITAL_COMMUNITY): Payer: Medicaid Other

## 2022-10-01 DIAGNOSIS — Z7982 Long term (current) use of aspirin: Secondary | ICD-10-CM | POA: Diagnosis not present

## 2022-10-01 DIAGNOSIS — K226 Gastro-esophageal laceration-hemorrhage syndrome: Secondary | ICD-10-CM | POA: Insufficient documentation

## 2022-10-01 DIAGNOSIS — R1011 Right upper quadrant pain: Secondary | ICD-10-CM

## 2022-10-01 DIAGNOSIS — R112 Nausea with vomiting, unspecified: Secondary | ICD-10-CM

## 2022-10-01 DIAGNOSIS — Z3A29 29 weeks gestation of pregnancy: Secondary | ICD-10-CM | POA: Diagnosis not present

## 2022-10-01 DIAGNOSIS — O219 Vomiting of pregnancy, unspecified: Secondary | ICD-10-CM | POA: Insufficient documentation

## 2022-10-01 HISTORY — DX: Gastro-esophageal reflux disease without esophagitis: K21.9

## 2022-10-01 LAB — URINALYSIS, ROUTINE W REFLEX MICROSCOPIC
Bilirubin Urine: NEGATIVE
Glucose, UA: 50 mg/dL — AB
Hgb urine dipstick: NEGATIVE
Ketones, ur: 80 mg/dL — AB
Nitrite: NEGATIVE
Protein, ur: 100 mg/dL — AB
Specific Gravity, Urine: 1.026 (ref 1.005–1.030)
pH: 6 (ref 5.0–8.0)

## 2022-10-01 LAB — CBC WITH DIFFERENTIAL/PLATELET
Abs Immature Granulocytes: 0.17 10*3/uL — ABNORMAL HIGH (ref 0.00–0.07)
Basophils Absolute: 0.1 10*3/uL (ref 0.0–0.1)
Basophils Relative: 0 %
Eosinophils Absolute: 0.2 10*3/uL (ref 0.0–0.5)
Eosinophils Relative: 2 %
HCT: 27.9 % — ABNORMAL LOW (ref 36.0–46.0)
Hemoglobin: 9.3 g/dL — ABNORMAL LOW (ref 12.0–15.0)
Immature Granulocytes: 1 %
Lymphocytes Relative: 13 %
Lymphs Abs: 1.9 10*3/uL (ref 0.7–4.0)
MCH: 32 pg (ref 26.0–34.0)
MCHC: 33.3 g/dL (ref 30.0–36.0)
MCV: 95.9 fL (ref 80.0–100.0)
Monocytes Absolute: 1.1 10*3/uL — ABNORMAL HIGH (ref 0.1–1.0)
Monocytes Relative: 8 %
Neutro Abs: 10.5 10*3/uL — ABNORMAL HIGH (ref 1.7–7.7)
Neutrophils Relative %: 76 %
Platelets: 240 10*3/uL (ref 150–400)
RBC: 2.91 MIL/uL — ABNORMAL LOW (ref 3.87–5.11)
RDW: 13 % (ref 11.5–15.5)
WBC: 13.9 10*3/uL — ABNORMAL HIGH (ref 4.0–10.5)
nRBC: 0 % (ref 0.0–0.2)

## 2022-10-01 LAB — COMPREHENSIVE METABOLIC PANEL
ALT: 10 U/L (ref 0–44)
AST: 16 U/L (ref 15–41)
Albumin: 2.4 g/dL — ABNORMAL LOW (ref 3.5–5.0)
Alkaline Phosphatase: 139 U/L — ABNORMAL HIGH (ref 38–126)
Anion gap: 8 (ref 5–15)
BUN: <5 mg/dL — ABNORMAL LOW (ref 6–20)
CO2: 21 mmol/L — ABNORMAL LOW (ref 22–32)
Calcium: 8.1 mg/dL — ABNORMAL LOW (ref 8.9–10.3)
Chloride: 106 mmol/L (ref 98–111)
Creatinine, Ser: 0.51 mg/dL (ref 0.44–1.00)
GFR, Estimated: >60 mL/min (ref >60)
Glucose, Bld: 86 mg/dL (ref 70–99)
Potassium: 3.6 mmol/L (ref 3.5–5.1)
Sodium: 135 mmol/L (ref 135–145)
Total Bilirubin: 0.7 mg/dL (ref 0.3–1.2)
Total Protein: 5.9 g/dL — ABNORMAL LOW (ref 6.5–8.1)

## 2022-10-01 LAB — LIPASE, BLOOD: Lipase: 21 U/L (ref 11–51)

## 2022-10-01 MED ORDER — LACTATED RINGERS IV BOLUS
1000.0000 mL | Freq: Once | INTRAVENOUS | Status: AC
  Administered 2022-10-01: 1000 mL via INTRAVENOUS

## 2022-10-01 MED ORDER — ONDANSETRON HCL 4 MG/2ML IJ SOLN
4.0000 mg | Freq: Once | INTRAMUSCULAR | Status: AC
  Administered 2022-10-01: 4 mg via INTRAVENOUS
  Filled 2022-10-01: qty 2

## 2022-10-01 MED ORDER — ACETAMINOPHEN 325 MG PO TABS
650.0000 mg | ORAL_TABLET | Freq: Once | ORAL | Status: AC
  Administered 2022-10-01: 650 mg via ORAL
  Filled 2022-10-01: qty 2

## 2022-10-01 NOTE — Progress Notes (Addendum)
I received a call from [facility] about this patient at 0731.  Admitted into [facility] and noticed that baby B appeared to be tracing maternal (HR in the 90s with moderate variability & accels).  Called pt's nurse to have her readjust the monitor to better trace baby B and also readjust toco.  Spoke with Dutch Quint, MD, regarding her care and asked if he would place an ultrasound on to confirm baby B's HR.  Spoke with Dr. Catalina [facility]) about pt.  Dr. Blinda Leatherwood called back to state he was able to see baby B's HR WNL.   Marcelle Overlie, OB Rapid Response Nurse

## 2022-10-01 NOTE — Progress Notes (Signed)
OB cleared by Dr. Scheryl Darter.  Spoke with Dr. Jeraldine Loots.  Pt to follow up with her OB docs as scheduled.

## 2022-10-01 NOTE — Discharge Instructions (Signed)
Please be sure to contact your OB team on Monday.  Return here for concerning changes in your condition.

## 2022-10-01 NOTE — ED Provider Notes (Signed)
Care of the patient assumed at signout.  I discussed the patient's case with our OB colleagues who states that the patient is appropriate for discharge on their end.  Ultrasound result inconsistent with gallbladder pathology.  Patient calm on repeat exam, has been comfortable with outpatient follow-up on Monday with OB.   Gerhard Munch, MD 10/01/22 1049

## 2022-10-01 NOTE — ED Notes (Signed)
OB rapid response nurse verified patient had been cleared by [facility] provider and could be removed from fetal monitoring.

## 2022-10-01 NOTE — Progress Notes (Signed)
Called [facility] to have nurse readjust monitor.  Baby B is still tracing maternal.

## 2022-10-01 NOTE — ED Triage Notes (Signed)
Pt c/o vomiting for past 24 hours, right rib side pain x 2 days, worse tonight.

## 2022-10-01 NOTE — ED Provider Notes (Signed)
[facility] Provider Note   CSN: [id number] Arrival date & time: 10/01/22  0359     History  Chief Complaint  Patient presents with   Emesis    Brandy Farrell is a 31 y.o. female.  Patient presents to the emergency department for evaluation of right-sided pain with nausea and vomiting.  Patient reports that symptoms began yesterday.  She has had persistent pain in the right mid back and abdomen area.  This has been causing nausea and vomiting.  Tonight she is still dry heaving, has nothing left to throw up.  She has noticed some blood in the minimal vomitus.       Home Medications Prior to Admission medications   Medication Sig Start Date End Date Taking? Authorizing Provider  aspirin 81 MG chewable tablet Chew 2 tablets (162 mg total) by mouth daily. 06/03/22   Myrtis Ser, CNM  Blood Pressure Monitor MISC For regular home bp monitoring during pregnancy Patient not taking: Reported on 09/26/2022 06/03/22   Myrtis Ser, CNM  buprenorphine (SUBUTEX) 8 MG SUBL SL tablet Place 8 mg under the tongue 2 (two) times daily. 09/10/21   [provider]  prenatal vitamin w/FE, FA (PRENATAL 1 + 1) 27-1 MG TABS tablet Take 1 tablet by mouth daily at 12 noon. 04/22/22   Estill Dooms, NP      Allergies    Patient has no known allergies.    Review of Systems   Review of Systems  Physical Exam Updated Vital Signs BP 136/73 (BP Location: Right Arm)   Pulse (!) 106   Temp 98.3 F (36.8 C) (Oral)   Resp 20   Ht 5\' 7"  (1.702 m)   Wt 99.3 kg   LMP 03/11/2022 (Exact Date)   SpO2 97%   BMI 34.30 kg/m  Physical Exam Vitals and nursing note reviewed.  Constitutional:      General: She is not in acute distress.    Appearance: She is well-developed.  HENT:     Head: Normocephalic and atraumatic.     Mouth/Throat:     Mouth: Mucous membranes are moist.  Eyes:     General: Vision grossly intact. Gaze aligned appropriately.      Extraocular Movements: Extraocular movements intact.     Conjunctiva/sclera: Conjunctivae normal.  Cardiovascular:     Rate and Rhythm: Normal rate and regular rhythm.     Pulses: Normal pulses.     Heart sounds: Normal heart sounds, S1 normal and S2 normal. No murmur heard.    No friction rub. No gallop.  Pulmonary:     Effort: Pulmonary effort is normal. No respiratory distress.     Breath sounds: Normal breath sounds.  Abdominal:     General: Bowel sounds are normal.     Palpations: Abdomen is soft.     Tenderness: There is no abdominal tenderness. There is no guarding or rebound.     Hernia: No hernia is present.  Musculoskeletal:        General: No swelling.     Cervical back: Full passive range of motion without pain, normal range of motion and neck supple. No spinous process tenderness or muscular tenderness. Normal range of motion.     Right lower leg: No edema.     Left lower leg: No edema.  Skin:    General: Skin is warm and dry.     Capillary Refill: Capillary refill takes less than 2 seconds.  Findings: No ecchymosis, erythema, rash or wound.  Neurological:     General: No focal deficit present.     Mental Status: She is alert and oriented to person, place, and time.     GCS: GCS eye subscore is 4. GCS verbal subscore is 5. GCS motor subscore is 6.     Cranial Nerves: Cranial nerves 2-12 are intact.     Sensory: Sensation is intact.     Motor: Motor function is intact.     Coordination: Coordination is intact.  Psychiatric:        Attention and Perception: Attention normal.        Mood and Affect: Mood normal.        Speech: Speech normal.        Behavior: Behavior normal.     ED Results / Procedures / Treatments   Labs (all labs ordered are listed, but only abnormal results are displayed) Labs Reviewed  CBC WITH DIFFERENTIAL/PLATELET - Abnormal; Notable for the following components:      Result Value   WBC 13.9 (*)    RBC 2.91 (*)    Hemoglobin 9.3 (*)     HCT 27.9 (*)    Neutro Abs 10.5 (*)    Monocytes Absolute 1.1 (*)    Abs Immature Granulocytes 0.17 (*)    All other components within normal limits  COMPREHENSIVE METABOLIC PANEL - Abnormal; Notable for the following components:   CO2 21 (*)    BUN <5 (*)    Calcium 8.1 (*)    Total Protein 5.9 (*)    Albumin 2.4 (*)    Alkaline Phosphatase 139 (*)    All other components within normal limits  LIPASE, BLOOD  URINALYSIS, ROUTINE W REFLEX MICROSCOPIC    EKG None  Radiology No results found.  Procedures Procedures    Medications Ordered in ED Medications  lactated ringers bolus 1,000 mL (0 mLs Intravenous Stopped 10/01/22 0639)  ondansetron (ZOFRAN) injection 4 mg (4 mg Intravenous Given 10/01/22 0528)  acetaminophen (TYLENOL) tablet 650 mg (650 mg Oral Given 10/01/22 4098)    ED Course/ Medical Decision Making/ A&P                           Medical Decision Making Amount and/or Complexity of Data Reviewed Labs: ordered. Radiology: ordered.  Risk OTC drugs. Prescription drug management.   Patient approximately [redacted] weeks pregnant with twins.  She developed right sided back and upper abdomen pain yesterday with nausea and vomiting.  She came in tonight because she noticed blood in the vomit.  Patient does report that she had multiple episodes of emesis and when she saw the blood it was mostly dry heaving.  This is likely Mallory-Weiss tear.  No coffee-ground emesis.  No further vomiting here.  Hemoglobin stable.  Doubt any need for further workup or upper GI bleeding.  Initial source of the pain and vomiting is unclear.  She would be at risk for gallbladder disease.  Will obtain gallbladder ultrasound.  Patient currently undergoing obstetric monitoring by telemetry.  Await clearance by OB/GYN.  Urinalysis to be collected to rule out pyelonephritis as a cause of symptoms. Will sign out to oncoming ER physician.        Final Clinical Impression(s) / ED  Diagnoses Final diagnoses:  Nausea and vomiting, unspecified vomiting type  Mallory-Weiss tear  Right upper quadrant abdominal pain    Rx / DC Orders ED Discharge Orders  None         Orpah Greek, MD 10/01/22 [phone]

## 2022-10-05 ENCOUNTER — Other Ambulatory Visit: Payer: Medicaid Other

## 2022-10-17 NOTE — L&D Delivery Note (Signed)
32 y.o. G2P2003 at 64w2dadmitted this morning for IOL with Di/di twin, and leading twin cephalic.  She received pitocin for induction, followed by AROM.   2nd stage of labor was complicated by slow progress due to significant maternal edema including the perineum and fetal malposition, hence she spent about 3 hours pushing.    D[place], [place][[id number] Delivery Note At 2:36 PM a viable female was delivered via Vaginal, Spontaneous (Presentation: Left Occiput Anterior).  APGAR: 9, 9; weight 6 lb 3.8 oz (2830 g).   Placenta status: Spontaneous, Intact.  Cord: 3 vessels with the following complications: None.  Cord pH: not collected.    [place], [place][[id number]  At 2:46 PM a viable female was delivered via Vaginal, Spontaneous (Presentation: Right Occiput Posterior).  APGAR: 9, 9; weight 5 lb 12.4 oz (2620 g).   Placenta status: Manual removal;Retained, Adherent.  Cord: 3 vessels with the following complications: None.  Cord pH: not collected.  Anesthesia: Epidural Episiotomy: None Lacerations: shallow 1st degree laceration, with some vaginal abrasions Suture Repair:  none required Est. Blood Loss (mL): 1412  Noted to have significant bleeding prior to placental separation,  during 3rd stage of labor, despite controlled gentle cord traction, Placenta for baby A delivered spontaneously, however baby B's umbilical cord was noted to have avulsed at the site of placental insertion. A manual extraction was performed for baby B's placenta, with success. Placenta inspected and noted to be complete. Manual sweep of the uterus revealed no retained placental parts.  Due to persistent bleeding, IM methergine was administered, with improvement in uterine tone and bleeding.  IV cefazolin 1g ordered for infection prophylaxis.  Mom to postpartum.  Baby to Couplet care / Skin to Skin.  Dr ARoselie Awkwardwas present for delivery.  CLiliane ChannelMD MPH OB Fellow, FOns[place]for [place]2/08/2023

## 2022-10-21 ENCOUNTER — Encounter: Payer: Self-pay | Admitting: Women's Health

## 2022-10-21 DIAGNOSIS — O30043 Twin pregnancy, dichorionic/diamniotic, third trimester: Secondary | ICD-10-CM

## 2022-10-24 ENCOUNTER — Encounter: Payer: Medicaid Other | Admitting: Women's Health

## 2022-10-24 ENCOUNTER — Other Ambulatory Visit: Payer: Medicaid Other

## 2022-10-24 ENCOUNTER — Encounter: Payer: Medicaid Other | Admitting: Obstetrics & Gynecology

## 2022-11-03 ENCOUNTER — Encounter: Payer: Self-pay | Admitting: Women's Health

## 2022-11-07 ENCOUNTER — Encounter: Payer: Self-pay | Admitting: Advanced Practice Midwife

## 2022-11-07 ENCOUNTER — Encounter: Payer: Medicaid Other | Admitting: Obstetrics & Gynecology

## 2022-11-07 ENCOUNTER — Ambulatory Visit (INDEPENDENT_AMBULATORY_CARE_PROVIDER_SITE_OTHER): Payer: Medicaid Other | Admitting: Advanced Practice Midwife

## 2022-11-07 VITALS — BP 114/66 | HR 91 | Wt 224.0 lb

## 2022-11-07 DIAGNOSIS — O99320 Drug use complicating pregnancy, unspecified trimester: Secondary | ICD-10-CM

## 2022-11-07 DIAGNOSIS — Z3A34 34 weeks gestation of pregnancy: Secondary | ICD-10-CM

## 2022-11-07 DIAGNOSIS — F1120 Opioid dependence, uncomplicated: Secondary | ICD-10-CM

## 2022-11-07 DIAGNOSIS — O30043 Twin pregnancy, dichorionic/diamniotic, third trimester: Secondary | ICD-10-CM

## 2022-11-07 DIAGNOSIS — O0993 Supervision of high risk pregnancy, unspecified, third trimester: Secondary | ICD-10-CM

## 2022-11-07 NOTE — Progress Notes (Signed)
HIGH-RISK PREGNANCY VISIT Patient name: Brandy Farrell MRN [id number]  Date of birth: 1991/05/08 Chief Complaint:   Routine Prenatal Visit and High Risk Gestation (Legs swelling)  History of Present Illness:   Brandy Farrell is a 32 y.o. G88P1001 female at [redacted]w[redacted]d with an Estimated Date of Delivery: 12/16/22 being seen today for ongoing management of a high-risk pregnancy complicated by multiple gestation Di/Di twins and SUD, on subutex.  Has missed several months' appointments d/t transportation issues, says they are resolved now and can make the rest of her appointments. States her subutex dosing is working well.   Today she reports normal pregnancy complaints. Contractions: Not present.  .  Movement: Present. denies leaking of fluid.      06/03/2022   10:20 AM 06/03/2022   10:08 AM 09/14/2021    9:14 AM 01/09/2018    3:21 PM  Depression screen PHQ 2/9  Decreased Interest 1 1 0 1  Down, Depressed, Hopeless 0 0 0   PHQ - 2 Score 1 1 0 1  Altered sleeping 2 2 0 1  Tired, decreased energy 2 2 0 1  Change in appetite 0 0 0 3  Feeling bad or failure about yourself  0 0 0   Trouble concentrating 0 0 0 0  Moving slowly or fidgety/restless 0 0 0 0  Suicidal thoughts 0 0 0 0  PHQ-9 Score 5 5 0 6  Difficult doing work/chores    Somewhat difficult        06/03/2022   10:24 AM 06/03/2022   10:08 AM 09/14/2021    9:14 AM  GAD 7 : Generalized Anxiety Score  Nervous, Anxious, on Edge 0 0 0  Control/stop worrying 0 0 0  Worry too much - different things 0 0 0  Trouble relaxing 0 0 0  Restless 0 0 0  Easily annoyed or irritable 0 0 0  Afraid - awful might happen 0 0 0  Total GAD 7 Score 0 0 0     Review of Systems:   Pertinent items are noted in HPI Denies abnormal vaginal discharge w/ itching/odor/irritation, headaches, visual changes, shortness of breath, chest pain, abdominal pain, severe nausea/vomiting, or problems with urination or bowel movements unless otherwise stated  above. Pertinent History Reviewed:  Reviewed past medical,surgical, social, obstetrical and family history.  Reviewed problem list, medications and allergies. Physical Assessment:   Vitals:   11/07/22 1554  BP: 114/66  Pulse: 91  Weight: 101.6 kg  Body mass index is 35.08 kg/m.           Physical Examination:   General appearance: alert, well appearing, and in no distress  Mental status: alert, oriented to person, place, and time  Skin: warm & dry   Extremities: Edema: Trace    Cardiovascular: normal heart rate noted  Respiratory: normal respiratory effort, no distress  Abdomen: gravid, soft, non-tender  Pelvic: Cervical exam deferred   Extremities:  BLE, resolves when feet are propped up.  Can try support socks if desired       Fetal Status: Fetal Heart Rate (bpm): 152/147 Fundal Height: 40 cm Movement: Present    Fetal Surveillance Testing today: doppler   Chaperone: N/A    No results found for this or any previous visit (from the past 24 hour(s)).  Assessment & Plan:  High-risk pregnancy: G2P1001 at [redacted]w[redacted]d with an Estimated Date of Delivery: 12/16/22      ICD-10-CM   1. Encounter for supervision of high risk  pregnancy in third trimester, antepartum  O09.93     2. Dichorionic diamniotic twin pregnancy in third trimester  O30.043    twide weekly testing    3. Pregnancy complicated by subutex maintenance, antepartum ([place])  O99.320    F11.20    continue current regimen        Meds: No orders of the defined types were placed in this encounter.   Orders: No orders of the defined types were placed in this encounter.    Labs/procedures today: none   Reviewed: Preterm labor symptoms and general obstetric precautions including but not limited to vaginal bleeding, contractions, leaking of fluid and fetal movement were reviewed in detail with the patient.  All questions were answered. Does have home bp cuff. Office bp cuff given: not applicable. Check bp daily, let us  know if consistently >140 and/or >90.  Follow-up: Return for keep appts already scheduled and add PN2 only for 11/09/22..   Future Appointments  Date Time Provider [place]  11/09/2022  9:10 AM [facility]  11/17/2022  3:30 PM [facility] NURSE [facility]  11/21/2022  2:15 PM [place] - [facility] [place] [facility] None  11/21/2022  3:50 PM Florian Buff, MD [facility]  11/24/2022  3:30 PM [facility] NURSE [facility]  11/28/2022  3:30 PM [facility] NURSE [facility]  11/28/2022  3:50 PM Janyth Pupa, DO [facility]  12/01/2022  9:10 AM [facility] NURSE [facility]    No orders of the defined types were placed in this encounter.  Christin Fudge , DNP, [facility] 11/07/2022 11:17 PM

## 2022-11-07 NOTE — Patient Instructions (Addendum)
1. Before your test, do not eat or drink anything for 8-10 hours prior to your  appointment (a small amount of water is allowed and you may take any medicines you normally take). Be sure to drink lots of water the day before. 2. When you arrive, your blood will be drawn for a 'fasting' blood sugar level.  Then you will be given a sweetened carbonated beverage to drink. You should  complete drinking this beverage within five minutes. After finishing the  beverage, you will have your blood drawn exactly 1 and 2 hours later. Having  your blood drawn on time is an important part of this test. A total of three blood  samples will be done. 3. The test takes approximately 2  hours. During the test, do not have anything to  eat or drink. Do not smoke, chew gum (not even sugarless gum) or use breath mints.  4. During the test you should remain close by and seated as much as possible and  avoid walking around. You may want to bring a book or something else to  occupy your time.  5. After your test, you may eat and drink as normal. You may want to bring a snack  to eat after the test is finished. Your provider will advise you as to the results of  this test and any follow-up if necessary  If your sugar test is positive for gestational diabetes, you will be given an phone call and further instructions discussed. If you wish to know all of your test results before your next appointment, feel free to call the office, or look up your test results on Mychart.  (The range that the lab uses for normal values of the sugar test are not necessarily the range that is used for pregnant women; if your results are within the normal range, they are definitely normal.  However, if a value is deemed "high" by the lab, it may not be too high for a pregnant woman.  We will need to discuss the results if your value(s) fall in the "high" category).     Tdap Vaccine It is recommended that you get the Tdap vaccine during the  third trimester of EACH pregnancy to help protect your baby from getting pertussis (whooping cough) 27-36 weeks is the BEST time to do this so that you can pass the protection on to your baby. During pregnancy is better than after pregnancy, but if you are unable to get it during pregnancy it will be offered at the hospital. You will be offered this vaccine in the office after 27 weeks.  If you do not have health insurance, you can get the vaccine from the [facility] (no appointment needed).  Everyone who will be around your baby should also be up-to-date on their vaccines. Adults (who are not pregnant) only need 1 dose of Tdap during adulthood.    AM I IN LABOR? What is labor? Labor is the work that your body does to birth your baby. Your uterus (the womb) contracts. Your cervix (the mouth of the uterus) opens. You will push your baby out into the world.  What do contractions (labor pains) feel like? When they first start, contractions usually feel like cramps during your period. Sometimes you feel pain in your back. Most often, contractions feel like muscles pulling painfully in your lower belly. At first, the contractions will probably be 15 to 20 minutes apart. They will not feel too painful. As  labor goes on, the contractions get stronger, closer together, and more painful.  How do I time the contractions? Time your contractions by counting the number of minutes from the start of one contraction to the start of the next contraction.  What should I do when the contractions start? If it is night and you can sleep, sleep. If it happens during the day, here are some things you can do to take care of yourself at home: ? Walk. If the pains you are having are real labor, walking will make the contractions come faster and harder. If the contractions are not going to continue and be real labor, walking will make the contractions slow down. ? Take a shower or bath. This will  help you relax. ? Eat. Labor is a big event. It takes a lot of energy. ? Drink water. Not drinking enough water can cause false labor (contractions that hurt but do not open your cervix). If this is true labor, drinking water will help you have strength to get through your labor. ? Take a nap. Get all the rest you can. ? Get a massage. If your labor is in your back, a strong massage on your lower back may feel very good. Getting a foot massage is always good. ? Don't panic. You can do this. Your body was made for this. You are strong!  When should I go to the hospital or call my health care provider? ? Your contractions have been 5 minutes apart or less for at least 1 hour. ? If several contractions are so painful you cannot walk or talk during one. ? Your bag of waters breaks. (You may have a big gush of water or just water that runs down your legs when you walk.)  Are there other reasons to call my health care provider? Yes, you should call your health care provider or go to the hospital if you start to bleed like you are having a period-- blood that soaks your underwear or runs down your legs, if you have sudden severe pain, if your baby has not moved for several hours, or if you are leaking green fluid. The rule is as follows: If you are very concerned about something, call.

## 2022-11-09 ENCOUNTER — Other Ambulatory Visit: Payer: Medicaid Other

## 2022-11-10 LAB — CBC
Hematocrit: 27.7 % — ABNORMAL LOW (ref 34.0–46.6)
Hemoglobin: 9.4 g/dL — ABNORMAL LOW (ref 11.1–15.9)
MCH: 31.5 pg (ref 26.6–33.0)
MCHC: 33.9 g/dL (ref 31.5–35.7)
MCV: 93 fL (ref 79–97)
Platelets: 232 10*3/uL (ref 150–450)
RBC: 2.98 x10E6/uL — ABNORMAL LOW (ref 3.77–5.28)
RDW: 12.1 % (ref 11.7–15.4)
WBC: 8.3 10*3/uL (ref 3.4–10.8)

## 2022-11-10 LAB — RPR: RPR Ser Ql: NONREACTIVE

## 2022-11-10 LAB — GLUCOSE TOLERANCE, 2 HOURS W/ 1HR
Glucose, 1 hour: 127 mg/dL (ref 70–179)
Glucose, 2 hour: 103 mg/dL (ref 70–152)
Glucose, Fasting: 71 mg/dL (ref 70–91)

## 2022-11-10 LAB — HIV ANTIBODY (ROUTINE TESTING W REFLEX): HIV Screen 4th Generation wRfx: NONREACTIVE

## 2022-11-10 LAB — ANTIBODY SCREEN: Antibody Screen: NEGATIVE

## 2022-11-14 ENCOUNTER — Encounter: Payer: Self-pay | Admitting: Women's Health

## 2022-11-14 ENCOUNTER — Other Ambulatory Visit: Payer: Self-pay | Admitting: Women's Health

## 2022-11-14 MED ORDER — FERROUS SULFATE 325 (65 FE) MG PO TABS: 325.0000 mg | ORAL_TABLET | ORAL | 2 refills | Status: DC

## 2022-11-17 ENCOUNTER — Ambulatory Visit (INDEPENDENT_AMBULATORY_CARE_PROVIDER_SITE_OTHER): Payer: Medicaid Other | Admitting: *Deleted

## 2022-11-17 VITALS — BP 118/69 | HR 89 | Wt 228.0 lb

## 2022-11-17 DIAGNOSIS — O0993 Supervision of high risk pregnancy, unspecified, third trimester: Secondary | ICD-10-CM

## 2022-11-17 DIAGNOSIS — O30043 Twin pregnancy, dichorionic/diamniotic, third trimester: Secondary | ICD-10-CM

## 2022-11-17 DIAGNOSIS — Z3A35 35 weeks gestation of pregnancy: Secondary | ICD-10-CM | POA: Diagnosis not present

## 2022-11-17 NOTE — Progress Notes (Signed)
   NURSE VISIT- NST  SUBJECTIVE:  SHIANN KAM is a 32 y.o. G2P1001 female at [redacted]w[redacted]d, here for a NST for pregnancy complicated by Multiple gestation.  She reports active fetal movement, contractions: none, vaginal bleeding: none, membranes: intact.   OBJECTIVE:  BP 118/69   Pulse 89   Wt 228 lb (103.4 kg)   LMP 03/11/2022 (Exact Date)   BMI 35.71 kg/m   Appears well, no apparent distress  No results found for this or any previous visit (from the past 24 hour(s)).  NST: FHR A baseline 125 bpm, Variability: moderate, Accelerations:present, Decelerations:  Absent= Cat 1/reactive FHR B baseline 130, Accelerations:present, Decelerations:absent Absent= Cat1/reactive   Toco: none   ASSESSMENT: G2P1001 at [redacted]w[redacted]d with Multiple gestation NST reactive  PLAN: EFM strip reviewed by Dr. Elonda Husky   Recommendations: keep next appointment as scheduled    Alice Rieger  11/17/2022 4:35 PM

## 2022-11-21 ENCOUNTER — Ambulatory Visit (INDEPENDENT_AMBULATORY_CARE_PROVIDER_SITE_OTHER): Payer: Medicaid Other | Admitting: Obstetrics & Gynecology

## 2022-11-21 ENCOUNTER — Other Ambulatory Visit: Payer: Self-pay | Admitting: Women's Health

## 2022-11-21 ENCOUNTER — Ambulatory Visit (INDEPENDENT_AMBULATORY_CARE_PROVIDER_SITE_OTHER): Payer: Medicaid Other

## 2022-11-21 ENCOUNTER — Encounter: Payer: Self-pay | Admitting: Obstetrics & Gynecology

## 2022-11-21 ENCOUNTER — Other Ambulatory Visit (HOSPITAL_COMMUNITY)
Admission: RE | Admit: 2022-11-21 | Discharge: 2022-11-21 | Disposition: A | Discharge status: Home / Self Care | Payer: Medicaid Other | Source: Ambulatory Visit | Attending: Obstetrics & Gynecology | Admitting: Obstetrics & Gynecology

## 2022-11-21 ENCOUNTER — Other Ambulatory Visit (HOSPITAL_COMMUNITY): Payer: Self-pay | Admitting: Advanced Practice Midwife

## 2022-11-21 VITALS — BP 127/82 | HR 82 | Wt 232.0 lb

## 2022-11-21 DIAGNOSIS — O09893 Supervision of other high risk pregnancies, third trimester: Secondary | ICD-10-CM | POA: Diagnosis present

## 2022-11-21 DIAGNOSIS — Z3A36 36 weeks gestation of pregnancy: Secondary | ICD-10-CM

## 2022-11-21 DIAGNOSIS — O30043 Twin pregnancy, dichorionic/diamniotic, third trimester: Secondary | ICD-10-CM

## 2022-11-21 DIAGNOSIS — O0993 Supervision of high risk pregnancy, unspecified, third trimester: Secondary | ICD-10-CM

## 2022-11-21 DIAGNOSIS — F1120 Opioid dependence, uncomplicated: Secondary | ICD-10-CM

## 2022-11-21 MED ORDER — ACYCLOVIR 400 MG PO TABS: 400.0000 mg | ORAL_TABLET | Freq: Three times a day (TID) | ORAL | 2 refills | Status: AC

## 2022-11-21 NOTE — Progress Notes (Signed)
HIGH-RISK PREGNANCY VISIT Patient name: Brandy Farrell MRN [id number]  Date of birth: 10-May-1991 Chief Complaint:   Routine Prenatal Visit  History of Present Illness:   Brandy Farrell is a 32 y.o. G32P1001 female at [redacted]w[redacted]d with an Estimated Date of Delivery: 12/16/22 being seen today for ongoing management of a high-risk pregnancy complicated by DCDA twins--.boys good growth today.    Today she reports no complaints. Contractions: Not present. Vag. Bleeding: None.  Movement: Present. denies leaking of fluid.      06/03/2022   10:20 AM 06/03/2022   10:08 AM 09/14/2021    9:14 AM 01/09/2018    3:21 PM  Depression screen PHQ 2/9  Decreased Interest 1 1 0 1  Down, Depressed, Hopeless 0 0 0   PHQ - 2 Score 1 1 0 1  Altered sleeping 2 2 0 1  Tired, decreased energy 2 2 0 1  Change in appetite 0 0 0 3  Feeling bad or failure about yourself  0 0 0   Trouble concentrating 0 0 0 0  Moving slowly or fidgety/restless 0 0 0 0  Suicidal thoughts 0 0 0 0  PHQ-9 Score 5 5 0 6  Difficult doing work/chores    Somewhat difficult        06/03/2022   10:24 AM 06/03/2022   10:08 AM 09/14/2021    9:14 AM  GAD 7 : Generalized Anxiety Score  Nervous, Anxious, on Edge 0 0 0  Control/stop worrying 0 0 0  Worry too much - different things 0 0 0  Trouble relaxing 0 0 0  Restless 0 0 0  Easily annoyed or irritable 0 0 0  Afraid - awful might happen 0 0 0  Total GAD 7 Score 0 0 0     Review of Systems:   Pertinent items are noted in HPI Denies abnormal vaginal discharge w/ itching/odor/irritation, headaches, visual changes, shortness of breath, chest pain, abdominal pain, severe nausea/vomiting, or problems with urination or bowel movements unless otherwise stated above. Pertinent History Reviewed:  Reviewed past medical,surgical, social, obstetrical and family history.  Reviewed problem list, medications and allergies. Physical Assessment:   Vitals:   11/21/22 1522  BP: 127/82   Pulse: 82  Weight: 232 lb (105.2 kg)  Body mass index is 36.34 kg/m.           Physical Examination:   General appearance: alert, well appearing, and in no distress  Mental status: alert, oriented to person, place, and time  Skin: warm & dry   Extremities: Edema: Trace    Cardiovascular: normal heart rate noted  Respiratory: normal respiratory effort, no distress  Abdomen: gravid, soft, non-tender  Pelvic: Cervical exam performed  3/80/-2 vertex       Fetal Status:     Movement: Present    Fetal Surveillance Testing today: sonogram with good growth x 2   Chaperone: Celene Squibb LPN  No results found for this or any previous visit (from the past 24 hour(s)).  Assessment & Plan:  High-risk pregnancy: G2P1001 at [redacted]w[redacted]d with an Estimated Date of Delivery: 12/16/22      ICD-10-CM   1. Supervision of other high risk pregnancies, third trimester  O09.893 Culture, beta strep (group b only)    Cervicovaginal ancillary only( [place])    2. Dichorionic diamniotic twin pregnancy in third trimester  O30.043     3. [redacted] weeks gestation of pregnancy  Z3A.36 Culture, beta strep (group b only)  Cervicovaginal ancillary only( [place])        Meds:  Meds ordered this encounter  Medications   acyclovir (ZOVIRAX) 400 MG tablet    Sig: Take 1 tablet (400 mg total) by mouth 3 (three) times daily.    Dispense:  90 tablet    Refill:  2    Orders:  Orders Placed This Encounter  Procedures   Culture, beta strep (group b only)     Labs/procedures today: U/S  Treatment Plan:  IOL 11/27/22  Reviewed: Term labor symptoms and general obstetric precautions including but not limited to vaginal bleeding, contractions, leaking of fluid and fetal movement were reviewed in detail with the patient.  All questions were answered. Does have home bp cuff. Office bp cuff given: not applicable. Check bp weekly, let us know if consistently >140 and/or >90.  Follow-up: Return in about 6 weeks  (around 01/02/2023) for post partum visit.   Future Appointments  Date Time Provider [place]  11/24/2022  3:30 PM [facility] NURSE [facility]  11/27/2022 12:00 AM [facility] [place] [facility] None  11/28/2022  3:30 PM [facility] NURSE [facility]  11/28/2022  3:50 PM Janyth Pupa, DO [facility]  12/01/2022  9:10 AM [facility] NURSE [facility]    Orders Placed This Encounter  Procedures   Culture, beta strep (group b only)   Florian Buff  Attending Physician for the [facility] 11/21/2022 3:51 PM

## 2022-11-21 NOTE — Progress Notes (Signed)
[place] DC/DA TWINS 36+3 wks, BABY A: cephalic left,FHR 373 bpm,BPP 8/8,SVP of fluid 3.9 cm,posterior placenta gr 3,EFW 2944 g 54% BABY B:transverse head left,FHR 144 bpm,BPP 8/8,SVP of fluid 5 cm,anterior placenta gr 3,EFW 2761 g 35%,discordance 6.2% Limited ultrasound because of fetal position

## 2022-11-22 ENCOUNTER — Telehealth (HOSPITAL_COMMUNITY): Payer: Self-pay | Admitting: *Deleted

## 2022-11-22 ENCOUNTER — Encounter (HOSPITAL_COMMUNITY): Payer: Self-pay | Admitting: *Deleted

## 2022-11-22 NOTE — Telephone Encounter (Signed)
Preadmission screen  

## 2022-11-23 ENCOUNTER — Other Ambulatory Visit: Payer: Self-pay | Admitting: Advanced Practice Midwife

## 2022-11-23 LAB — CERVICOVAGINAL ANCILLARY ONLY
Chlamydia: NEGATIVE
Comment: NEGATIVE
Comment: NORMAL
Neisseria Gonorrhea: NEGATIVE

## 2022-11-24 ENCOUNTER — Other Ambulatory Visit: Payer: Medicaid Other

## 2022-11-25 ENCOUNTER — Other Ambulatory Visit: Payer: Self-pay | Admitting: Advanced Practice Midwife

## 2022-11-25 LAB — CULTURE, BETA STREP (GROUP B ONLY): Strep Gp B Culture: NEGATIVE

## 2022-11-27 ENCOUNTER — Other Ambulatory Visit: Payer: Self-pay

## 2022-11-27 ENCOUNTER — Inpatient Hospital Stay (HOSPITAL_COMMUNITY): Payer: Medicaid Other | Admitting: Anesthesiology

## 2022-11-27 ENCOUNTER — Inpatient Hospital Stay (HOSPITAL_COMMUNITY)
Admission: RE | Admit: 2022-11-27 | Discharge: 2022-11-29 | DRG: 806 | Disposition: A | Discharge status: Home / Self Care | Payer: Medicaid Other | Attending: Obstetrics & Gynecology | Admitting: Obstetrics & Gynecology

## 2022-11-27 ENCOUNTER — Inpatient Hospital Stay (HOSPITAL_COMMUNITY): Payer: Medicaid Other

## 2022-11-27 ENCOUNTER — Encounter (HOSPITAL_COMMUNITY): Payer: Self-pay | Admitting: Obstetrics & Gynecology

## 2022-11-27 DIAGNOSIS — O99324 Drug use complicating childbirth: Secondary | ICD-10-CM | POA: Diagnosis present

## 2022-11-27 DIAGNOSIS — O9832 Other infections with a predominantly sexual mode of transmission complicating childbirth: Secondary | ICD-10-CM | POA: Diagnosis present

## 2022-11-27 DIAGNOSIS — Z7982 Long term (current) use of aspirin: Secondary | ICD-10-CM

## 2022-11-27 DIAGNOSIS — O99214 Obesity complicating childbirth: Secondary | ICD-10-CM | POA: Diagnosis present

## 2022-11-27 DIAGNOSIS — F1120 Opioid dependence, uncomplicated: Secondary | ICD-10-CM | POA: Diagnosis present

## 2022-11-27 DIAGNOSIS — A6000 Herpesviral infection of urogenital system, unspecified: Secondary | ICD-10-CM | POA: Diagnosis present

## 2022-11-27 DIAGNOSIS — O30043 Twin pregnancy, dichorionic/diamniotic, third trimester: Secondary | ICD-10-CM | POA: Diagnosis present

## 2022-11-27 DIAGNOSIS — Z3A37 37 weeks gestation of pregnancy: Secondary | ICD-10-CM

## 2022-11-27 DIAGNOSIS — O30009 Twin pregnancy, unspecified number of placenta and unspecified number of amniotic sacs, unspecified trimester: Secondary | ICD-10-CM | POA: Diagnosis present

## 2022-11-27 DIAGNOSIS — Z87891 Personal history of nicotine dependence: Secondary | ICD-10-CM

## 2022-11-27 DIAGNOSIS — O26893 Other specified pregnancy related conditions, third trimester: Secondary | ICD-10-CM | POA: Diagnosis present

## 2022-11-27 HISTORY — DX: Herpesviral infection, unspecified: B00.9

## 2022-11-27 LAB — RPR: RPR Ser Ql: NONREACTIVE

## 2022-11-27 LAB — CBC
HCT: 27.4 % — ABNORMAL LOW (ref 36.0–46.0)
Hemoglobin: 9.7 g/dL — ABNORMAL LOW (ref 12.0–15.0)
MCH: 32.3 pg (ref 26.0–34.0)
MCHC: 35.4 g/dL (ref 30.0–36.0)
MCV: 91.3 fL (ref 80.0–100.0)
Platelets: 224 10*3/uL (ref 150–400)
RBC: 3 MIL/uL — ABNORMAL LOW (ref 3.87–5.11)
RDW: 13.6 % (ref 11.5–15.5)
WBC: 7.6 10*3/uL (ref 4.0–10.5)
nRBC: 0 % (ref 0.0–0.2)

## 2022-11-27 LAB — PREPARE RBC (CROSSMATCH)

## 2022-11-27 MED ORDER — ONDANSETRON HCL 4 MG PO TABS: 4.0000 mg | ORAL_TABLET | ORAL | Status: DC | PRN

## 2022-11-27 MED ORDER — EPHEDRINE 5 MG/ML INJ: 10.0000 mg | INTRAVENOUS | Status: DC | PRN

## 2022-11-27 MED ORDER — METHYLERGONOVINE MALEATE 0.2 MG/ML IJ SOLN
INTRAMUSCULAR | Status: AC
  Administered 2022-11-27: 0.2 mg
  Filled 2022-11-27: qty 1

## 2022-11-27 MED ORDER — OXYTOCIN-SODIUM CHLORIDE 30-0.9 UT/500ML-% IV SOLN
1.0000 m[IU]/min | INTRAVENOUS | Status: DC
  Administered 2022-11-27: 2 m[IU]/min via INTRAVENOUS

## 2022-11-27 MED ORDER — DIPHENHYDRAMINE HCL 50 MG/ML IJ SOLN
12.5000 mg | INTRAMUSCULAR | Status: DC | PRN
  Filled 2022-11-27: qty 1

## 2022-11-27 MED ORDER — FUROSEMIDE 20 MG PO TABS
20.0000 mg | ORAL_TABLET | Freq: Every day | ORAL | Status: DC
  Administered 2022-11-28 – 2022-11-29 (×2): 20 mg via ORAL
  Filled 2022-11-27 (×2): qty 1

## 2022-11-27 MED ORDER — DIBUCAINE (PERIANAL) 1 % EX OINT: 1.0000 | TOPICAL_OINTMENT | CUTANEOUS | Status: DC | PRN

## 2022-11-27 MED ORDER — DIPHENHYDRAMINE HCL 50 MG/ML IJ SOLN
25.0000 mg | Freq: Once | INTRAMUSCULAR | Status: AC
  Administered 2022-11-27: 25 mg via INTRAVENOUS

## 2022-11-27 MED ORDER — PHENYLEPHRINE 80 MCG/ML (10ML) SYRINGE FOR IV PUSH (FOR BLOOD PRESSURE SUPPORT): 80.0000 ug | PREFILLED_SYRINGE | INTRAVENOUS | Status: DC | PRN

## 2022-11-27 MED ORDER — OXYTOCIN-SODIUM CHLORIDE 30-0.9 UT/500ML-% IV SOLN
2.5000 [IU]/h | INTRAVENOUS | Status: DC
  Filled 2022-11-27: qty 500

## 2022-11-27 MED ORDER — ZOLPIDEM TARTRATE 5 MG PO TABS: 5.0000 mg | ORAL_TABLET | Freq: Every evening | ORAL | Status: DC | PRN

## 2022-11-27 MED ORDER — COCONUT OIL OIL: 1.0000 | TOPICAL_OIL | Status: DC | PRN

## 2022-11-27 MED ORDER — SODIUM CHLORIDE 0.9% IV SOLUTION: Freq: Once | INTRAVENOUS | Status: AC

## 2022-11-27 MED ORDER — ONDANSETRON HCL 4 MG/2ML IJ SOLN: 4.0000 mg | Freq: Four times a day (QID) | INTRAMUSCULAR | Status: DC | PRN

## 2022-11-27 MED ORDER — SODIUM CHLORIDE 0.9% FLUSH: 3.0000 mL | INTRAVENOUS | Status: DC | PRN

## 2022-11-27 MED ORDER — WITCH HAZEL-GLYCERIN EX PADS
1.0000 | MEDICATED_PAD | CUTANEOUS | Status: DC | PRN
  Administered 2022-11-28: 1 via TOPICAL

## 2022-11-27 MED ORDER — SODIUM CHLORIDE 0.9% FLUSH
3.0000 mL | Freq: Two times a day (BID) | INTRAVENOUS | Status: DC
  Administered 2022-11-29: 3 mL via INTRAVENOUS

## 2022-11-27 MED ORDER — CEFAZOLIN (ANCEF) 1 G IV SOLR: 1.0000 g | INTRAVENOUS | Status: DC

## 2022-11-27 MED ORDER — SODIUM CHLORIDE 0.9 % IV SOLN: INTRAVENOUS | Status: DC | PRN

## 2022-11-27 MED ORDER — FUROSEMIDE 10 MG/ML IJ SOLN
40.0000 mg | Freq: Once | INTRAMUSCULAR | Status: AC
  Administered 2022-11-27: 40 mg via INTRAVENOUS
  Filled 2022-11-27 (×2): qty 4

## 2022-11-27 MED ORDER — IBUPROFEN 600 MG PO TABS
600.0000 mg | ORAL_TABLET | Freq: Four times a day (QID) | ORAL | Status: DC
  Administered 2022-11-27 – 2022-11-29 (×9): 600 mg via ORAL
  Filled 2022-11-27 (×9): qty 1

## 2022-11-27 MED ORDER — PRENATAL MULTIVITAMIN CH
1.0000 | ORAL_TABLET | Freq: Every day | ORAL | Status: DC
  Administered 2022-11-28 – 2022-11-29 (×2): 1 via ORAL
  Filled 2022-11-27 (×2): qty 1

## 2022-11-27 MED ORDER — OXYTOCIN BOLUS FROM INFUSION: 333.0000 mL | Freq: Once | INTRAVENOUS | Status: DC

## 2022-11-27 MED ORDER — BENZOCAINE-MENTHOL 20-0.5 % EX AERO
1.0000 | INHALATION_SPRAY | CUTANEOUS | Status: DC | PRN
  Administered 2022-11-28: 1 via TOPICAL
  Filled 2022-11-27: qty 56

## 2022-11-27 MED ORDER — BUPRENORPHINE HCL 2 MG SL SUBL
8.0000 mg | SUBLINGUAL_TABLET | Freq: Three times a day (TID) | SUBLINGUAL | Status: DC
  Administered 2022-11-27 – 2022-11-29 (×6): 8 mg via SUBLINGUAL
  Filled 2022-11-27 (×6): qty 4

## 2022-11-27 MED ORDER — ACETAMINOPHEN 325 MG PO TABS: 650.0000 mg | ORAL_TABLET | ORAL | Status: DC | PRN

## 2022-11-27 MED ORDER — OXYCODONE HCL 5 MG PO TABS: 5.0000 mg | ORAL_TABLET | ORAL | Status: DC | PRN

## 2022-11-27 MED ORDER — LIDOCAINE HCL (PF) 1 % IJ SOLN
INTRAMUSCULAR | Status: DC | PRN
  Administered 2022-11-27 (×2): 4 mL via EPIDURAL

## 2022-11-27 MED ORDER — DIPHENHYDRAMINE HCL 25 MG PO CAPS: 25.0000 mg | ORAL_CAPSULE | Freq: Four times a day (QID) | ORAL | Status: DC | PRN

## 2022-11-27 MED ORDER — LACTATED RINGERS IV SOLN: 500.0000 mL | Freq: Once | INTRAVENOUS | Status: AC

## 2022-11-27 MED ORDER — TETANUS-DIPHTH-ACELL PERTUSSIS 5-2.5-18.5 LF-MCG/0.5 IM SUSY: 0.5000 mL | PREFILLED_SYRINGE | Freq: Once | INTRAMUSCULAR | Status: DC

## 2022-11-27 MED ORDER — BUPRENORPHINE HCL 2 MG SL SUBL: 8.0000 mg | SUBLINGUAL_TABLET | Freq: Two times a day (BID) | SUBLINGUAL | Status: DC

## 2022-11-27 MED ORDER — TERBUTALINE SULFATE 1 MG/ML IJ SOLN: 0.2500 mg | Freq: Once | INTRAMUSCULAR | Status: DC | PRN

## 2022-11-27 MED ORDER — CEFAZOLIN SODIUM-DEXTROSE 2-4 GM/100ML-% IV SOLN
2.0000 g | Freq: Once | INTRAVENOUS | Status: AC
  Administered 2022-11-28: 2 g via INTRAVENOUS
  Filled 2022-11-27 (×2): qty 100

## 2022-11-27 MED ORDER — SIMETHICONE 80 MG PO CHEW: 80.0000 mg | CHEWABLE_TABLET | ORAL | Status: DC | PRN

## 2022-11-27 MED ORDER — OXYCODONE-ACETAMINOPHEN 5-325 MG PO TABS: 2.0000 | ORAL_TABLET | ORAL | Status: DC | PRN

## 2022-11-27 MED ORDER — LIDOCAINE HCL (PF) 1 % IJ SOLN: 30.0000 mL | INTRAMUSCULAR | Status: DC | PRN

## 2022-11-27 MED ORDER — ACETAMINOPHEN 325 MG PO TABS
650.0000 mg | ORAL_TABLET | ORAL | Status: DC | PRN
  Administered 2022-11-28: 650 mg via ORAL
  Filled 2022-11-27: qty 2

## 2022-11-27 MED ORDER — ONDANSETRON HCL 4 MG/2ML IJ SOLN: 4.0000 mg | INTRAMUSCULAR | Status: DC | PRN

## 2022-11-27 MED ORDER — LACTATED RINGERS IV SOLN: 500.0000 mL | INTRAVENOUS | Status: DC | PRN

## 2022-11-27 MED ORDER — FENTANYL CITRATE (PF) 100 MCG/2ML IJ SOLN
INTRAMUSCULAR | Status: AC
  Administered 2022-11-27: 100 ug
  Filled 2022-11-27: qty 2

## 2022-11-27 MED ORDER — SENNOSIDES-DOCUSATE SODIUM 8.6-50 MG PO TABS
2.0000 | ORAL_TABLET | ORAL | Status: DC
  Administered 2022-11-27 – 2022-11-29 (×3): 2 via ORAL
  Filled 2022-11-27 (×3): qty 2

## 2022-11-27 MED ORDER — SOD CITRATE-CITRIC ACID 500-334 MG/5ML PO SOLN
30.0000 mL | ORAL | Status: DC | PRN
  Administered 2022-11-27 (×2): 30 mL via ORAL
  Filled 2022-11-27 (×2): qty 30

## 2022-11-27 MED ORDER — FENTANYL-BUPIVACAINE-NACL 0.5-0.125-0.9 MG/250ML-% EP SOLN
12.0000 mL/h | EPIDURAL | Status: DC | PRN
  Administered 2022-11-27: 12 mL/h via EPIDURAL
  Filled 2022-11-27: qty 250

## 2022-11-27 MED ORDER — LACTATED RINGERS IV SOLN: INTRAVENOUS | Status: DC

## 2022-11-27 MED ORDER — OXYCODONE-ACETAMINOPHEN 5-325 MG PO TABS: 1.0000 | ORAL_TABLET | ORAL | Status: DC | PRN

## 2022-11-27 NOTE — Anesthesia Procedure Notes (Signed)
Epidural Patient location during procedure: OB Start time: 11/27/2022 2:21 AM End time: 11/27/2022 2:29 AM  Staffing Anesthesiologist: Josephine Igo, MD Performed: anesthesiologist   Preanesthetic Checklist Completed: patient identified, IV checked, site marked, risks and benefits discussed, surgical consent, monitors and equipment checked, pre-op evaluation and timeout performed  Epidural Patient position: sitting Prep: DuraPrep and site prepped and draped Patient monitoring: continuous pulse ox and blood pressure Approach: midline Location: L3-L4 Injection technique: LOR air  Needle:  Needle type: Tuohy  Needle gauge: 17 G Needle length: 9 cm and 9 Needle insertion depth: 6 cm Catheter type: closed end flexible Catheter size: 19 Gauge Catheter at skin depth: 11 cm Test dose: negative and Other  Assessment Events: blood not aspirated, no cerebrospinal fluid, injection not painful, no injection resistance, no paresthesia and negative IV test  Additional Notes Patient identified. Risks and benefits discussed including failed block, incomplete  Pain control, post dural puncture headache, nerve damage, paralysis, blood pressure Changes, nausea, vomiting, reactions to medications-both toxic and allergic and post Partum back pain. All questions were answered. Patient expressed understanding and wished to proceed. Sterile technique was used throughout procedure. Epidural site was Dressed with sterile barrier dressing. No paresthesias, signs of intravascular injection Or signs of intrathecal spread were encountered.  Patient was more comfortable after the epidural was dosed. Please see RN's note for documentation of vital signs and FHR which are stable. Reason for block:procedure for pain

## 2022-11-27 NOTE — H&P (Signed)
OBSTETRIC ADMISSION HISTORY AND PHYSICAL  Brandy Farrell is a 32 y.o. female G2P1001 with IUP at 6w2dby LMP presenting for IOL . She reports +FMs, No LOF, no VB, no blurry vision, headaches or peripheral edema, and RUQ pain.  She plans on breast and formula feeding. She request IUD for birth control. [facility]  Dating: By LMP --->  Estimated Date of Delivery: 12/16/22  Sono:   Baby A @[redacted]w[redacted]d$ , CWD, normal anatomy, cephalic presentation, posterior placenta, 2944g, 54% EFW  Baby B @[redacted]w[redacted]d$ , CWD, normal anatomy, transverse head left presentation, Anterior placenta , 2761g, 35% EFW  Prenatal History/Complications: Di-di twin pregnancy, pregnancy complicated by Subutex maintenance, HSV  Past Medical History: Past Medical History:  Diagnosis Date   GERD (gastroesophageal reflux disease)    History of narcotic addiction ([place]     Past Surgical History: History reviewed. No pertinent surgical history.  Obstetrical History: OB History     Gravida  2   Para  1   Term  1   Preterm      AB      Living  1      SAB      IAB      Ectopic      Multiple      Live Births  1           Social History Social History   Socioeconomic History   Marital status: DSoil scientist   Spouse name: Not on file   Number of children: Not on file   Years of education: Not on file   Highest education level: Not on file  Occupational History   Not on file  Tobacco Use   Smoking status: Former    Packs/day: 1.00    Years: 11.00    Total pack years: 11.00    Types: Cigarettes    Quit date: 12/20/2021    Years since quitting: 0.9   Smokeless tobacco: Never  Vaping Use   Vaping Use: Never used  Substance and Sexual Activity   Alcohol use: Not Currently    Comment: occasionally   Drug use: Not Currently    Types: Cocaine, Oxycodone   Sexual activity: Yes    Birth control/protection: None  Other Topics Concern   Not on file  Social  History Narrative   Not on file   Social Determinants of Health   Financial Resource Strain: Low Risk  (06/03/2022)   Overall Financial Resource Strain (CARDIA)    Difficulty of Paying Living Expenses: Not hard at all  Food Insecurity: No Food Insecurity (11/27/2022)   Hunger Vital Sign    Worried About Running Out of Food in the Last Year: Never true    [place] the Last Year: Never true  Transportation Needs: No Transportation Needs (11/27/2022)   PRAPARE - THydrologist(Medical): No    Lack of Transportation (Non-Medical): No  Physical Activity: Inactive (06/03/2022)   Exercise Vital Sign    Days of Exercise per Week: 0 days    Minutes of Exercise per Session: 60 min  Stress: No Stress Concern Present (06/03/2022)   [place]   Feeling of Stress : Not at all  Social Connections: Moderately Isolated (06/03/2022)   Social Connection and Isolation Panel [NHANES]    Frequency of Communication with Friends and Family: More than three times a week  Frequency of Social Gatherings with Friends and Family: Three times a week    Attends Religious Services: More than 4 times per year    Active Member of Clubs or Organizations: No    Attends Archivist Meetings: Never    Marital Status: Never married    Family History: Family History  Problem Relation Age of Onset   Hypertension Mother    Colon cancer Maternal Grandmother    Cancer Maternal Grandmother        colon   Cancer Other        PGGM    Allergies: No Known Allergies  Medications Prior to Admission  Medication Sig Dispense Refill Last Dose   acyclovir (ZOVIRAX) 400 MG tablet Take 1 tablet (400 mg total) by mouth 3 (three) times daily. 90 tablet 2    aspirin 81 MG chewable tablet Chew 2 tablets (162 mg total) by mouth daily. 60 tablet 7    Blood Pressure Monitor MISC For regular home bp monitoring during  pregnancy 1 each 0    buprenorphine (SUBUTEX) 8 MG SUBL SL tablet Place 8 mg under the tongue 2 (two) times daily.      ferrous sulfate 325 (65 FE) MG tablet Take 1 tablet (325 mg total) by mouth every other day. 45 tablet 2    prenatal vitamin w/FE, FA (PRENATAL 1 + 1) 27-1 MG TABS tablet Take 1 tablet by mouth daily at 12 noon. 30 tablet 12      Review of Systems   All systems reviewed and negative except as stated in HPI  Blood pressure 132/74, pulse 89, temperature 98.4 F (36.9 C), temperature source Oral, weight 110.4 kg, last menstrual period 03/11/2022. General appearance: alert, cooperative, and appears stated age Lungs: Normal work of breathing, speaking in full sentences Heart: Regular rate Abdomen: soft, non-tender; gravid Pelvic: [id number] cephalic Extremities: Considerable painful pitting edema in lower extremities bilaterally Twin A Presentation: cephalic Fetal monitoringBaseline: 125 bpm, Variability: Good {> 6 bpm), Accelerations: Reactive, and Decelerations: Absent Uterine activityNone   Twin B Presentation: Transverse Fetal monitoringBaseline: 130 bpm, Variability: Good {> 6 bpm), Accelerations: Reactive, and Decelerations: Absent Uterine activityNone    Prenatal labs: ABO, Rh: O/Positive/-- (08/18 1104) Antibody: Negative (01/24 0819) Rubella: 6.40 (08/18 1104) RPR: Non Reactive (01/24 0819)  HBsAg: Negative (08/18 1104)  HIV: Non Reactive (01/24 0819)  GBS: Negative/-- (02/05 1600)  1 hr Glucola normal Genetic screening low risk males Anatomy US normal males  Prenatal Transfer Tool  Maternal Diabetes: No Genetic Screening: Normal Maternal Ultrasounds/Referrals: Normal Fetal Ultrasounds or other Referrals:  Referred to Materal Fetal Medicine  Maternal Substance Abuse:  Yes:  Type: Other: Subutex Significant Maternal Medications:  None Significant Maternal Lab Results:  Group B Strep negative Number of Prenatal Visits:greater than 3 verified  prenatal visits Other Comments:  None  No results found for this or any previous visit (from the past 24 hour(s)).  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 G2P1001 at 41w2dhere for IOL for noted gestational pregnancy  #Labor:Will start induction with Pitocin.  Consider AROM at next check #Pain: Per patient request #FWB: Category 1 x 2 #ID:  GBS negative #MOF: Breast  and formula #MOC: IUD #Circ:  Yes x 2 Seropositive HSV: No outbreaks, on acyclovir which she started last week  Concepcion Living, MD  11/27/2022, 1:18 AM

## 2022-11-27 NOTE — Plan of Care (Signed)

## 2022-11-27 NOTE — Anesthesia Preprocedure Evaluation (Signed)
Anesthesia Evaluation  Patient identified by MRN, date of birth, ID band Patient awake    Reviewed: Allergy & Precautions, Patient's Chart, lab work & pertinent test results  Airway Mallampati: III       Dental no notable dental hx.    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   Endo/Other    Morbid obesity  Renal/GU negative Renal ROS  negative genitourinary   Musculoskeletal  (+)  narcotic dependent  Abdominal  (+) + obese  Peds  Hematology  (+) Blood dyscrasia, anemia   Anesthesia Other Findings   Reproductive/Obstetrics (+) Pregnancy Di/Di Twin Gestation 36 weeks HSV                              Anesthesia Physical Anesthesia Plan  ASA: 3  Anesthesia Plan: Epidural   Post-op Pain Management:    Induction:   PONV Risk Score and Plan:   Airway Management Planned: Natural Airway  Additional Equipment:   Intra-op Plan:   Post-operative Plan:   Informed Consent: I have reviewed the patients History and Physical, chart, labs and discussed the procedure including the risks, benefits and alternatives for the proposed anesthesia with the patient or authorized representative who has indicated his/her understanding and acceptance.       Plan Discussed with: Anesthesiologist  Anesthesia Plan Comments:          Anesthesia Quick Evaluation

## 2022-11-27 NOTE — Progress Notes (Signed)
LABOR PROGRESS NOTE  Brandy Farrell is a 32 y.o. G2P1001 at [redacted]w[redacted]d admitted for IOL for Di-Di twins   Subjective: Sleeping when I enter the room, epidural in place.   Objective: BP 135/80   Pulse 82   Temp 98.2 F (36.8 C) (Oral)   Resp 17   Ht 5' 6"$  (1.676 m)   Wt 110.4 kg   LMP 03/11/2022 (Exact Date)   SpO2 100%   BMI 39.27 kg/m  or  Vitals:   11/27/22 0530 11/27/22 0600 11/27/22 0630 11/27/22 0700  BP: 127/78 132/83 133/79 135/80  Pulse: 78 78 76 82  Resp: 17     Temp: 98.2 F (36.8 C)     TempSrc: Oral     SpO2:      Weight:      Height:       Dilation: 7 Effacement (%): 100 Station: 0 Presentation: Vertex Exam by:: DGlenice Bow rn Twin A FHT: baseline rate 125, moderate varibility, + acel, occasional variable decel Toco: every 3-4 min  Twin B FHT: baseline rate 125, moderate varibility, + acel, occasional variable decel Toco: every 3-4 min  Labs: Lab Results  Component Value Date   WBC 7.6 11/27/2022   HGB 9.7 (L) 11/27/2022   HCT 27.4 (L) 11/27/2022   MCV 91.3 11/27/2022   PLT 224 11/27/2022    Patient Active Problem List   Diagnosis Date Noted   Indication for care in labor or delivery 11/27/2022   Pregnancy complicated by subutex maintenance, antepartum ([place] 06/03/2022   Dichorionic diamniotic twin pregnancy 06/02/2022   Supervision of high-risk pregnancy 06/01/2022   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 G2P1001 at 396w2dere for IOL for di-di twin gestation   Labor: Progressing well spontaneously. Continue to monitor  Fetal Wellbeing:  Cat 2, anticipate delivery soon Pain Control:  Epidural in place  Anticipated MOD:  Vaginal   ViGifford ShaveMD  OB Fellow  11/27/2022, 7:09 AM

## 2022-11-27 NOTE — Progress Notes (Signed)
Evaluated patient previously.  RN checked her cervix and she is fully dilated.  Started pushing, but noted some swelling in the posterior cervix. IV Benadryl given, will hold off pushing for now, and allow to labor down for some time; and hopefully the cervical swelling will resolve. Both babies are category 1.  Plan to reevaluate in about an hour, and consider starting pushing  Liliane Channel MD MPH OB Fellow, [place] for [place] 11/27/2022

## 2022-11-27 NOTE — Progress Notes (Signed)
Talked to patient about Subutex.  I was told in report that the patient had her own supply with her and was medicating herself.  When I asked her if she was giving it to herself, she said "yes, that everyone knew she had it with her and had been giving it to herself."  I asked her not to medicate herself with her personal medications, to only take the medications the hospital provides for her for liability and documentation purposes while she was hospitalized.  I asked her when she gave herself her last dose and she said 4:30pm.  While I was starting her blood administration I mentioned it to her again while the charge nurse Wynelle Link) was in the room performing a double signoff on the blood with me.  She also confirmed with her not to medicate herself with her personal medications while in the hospital.

## 2022-11-27 NOTE — OR Nursing (Signed)
Pt took own Subutex 55m at this time.

## 2022-11-27 NOTE — Anesthesia Postprocedure Evaluation (Signed)
Anesthesia Post Note  Patient: Brandy Farrell  Procedure(s) Performed: AN AD HOC LABOR EPIDURAL     Patient location during evaluation: Mother Baby Anesthesia Type: Epidural Level of consciousness: awake and alert Pain management: pain level controlled Vital Signs Assessment: post-procedure vital signs reviewed and stable Respiratory status: spontaneous breathing, nonlabored ventilation and respiratory function stable Cardiovascular status: stable Postop Assessment: no headache, no backache and epidural receding Anesthetic complications: no   No notable events documented.  Last Vitals:  Vitals:   11/27/22 1615 11/27/22 1853  BP: (!) 149/88 132/75  Pulse: 98 (!) 101  Resp:  18  Temp:  (!) 38.4 C  SpO2:  100%    Last Pain:  Vitals:   11/27/22 1530  TempSrc: Oral  PainSc: 0-No pain   Pain Goal:                Epidural/Spinal Function Cutaneous sensation: Tingles (11/27/22 1853), Patient able to flex knees: Yes (11/27/22 1853), Patient able to lift hips off bed: Yes (11/27/22 1853), Back pain beyond tenderness at insertion site: No (11/27/22 1853), Progressively worsening motor and/or sensory loss: No (11/27/22 1853), Bowel and/or bladder incontinence post epidural: No (11/27/22 1853)  Gilmer Mor

## 2022-11-27 NOTE — Discharge Summary (Signed)
Postpartum Discharge Summary    Patient Name: Brandy Farrell DOB: 06/29/1991 MRN: [id number]  Date of admission: 11/27/2022 Delivery date:   Lewis, Glerum [id number]  11/27/2022    Tiaa, Kiah [id number]  11/27/2022  Delivering provider:    Sage, Kinlaw [id number]  Kurston, Minervini [place] [id number]  NDULUE, Lyndel Safe  Date of discharge: 11/27/2022  Admitting diagnosis: Indication for care in labor or delivery [O75.9] Intrauterine pregnancy: [redacted]w[redacted]d    Secondary diagnosis:  Principal Problem:   Twin pregnancy, delivered vaginally, current hospitalization Active Problems:   Indication for care in labor or delivery   Postpartum hemorrhage, delivered, current hospitalization  Additional problems: PRBC transfusion 1 unit    Discharge diagnosis: Term Pregnancy Delivered                                              Post partum procedures:blood transfusion Augmentation: AROM and Pitocin Complications: [id number] cord avulsion, requiring manual placental extraction  Hospital course: Induction of Labor With Vaginal Delivery   32y.o. yo G2P2003 at 379w2das admitted to the hospital 11/27/2022 for induction of labor.  Indication for induction:  multiple gestation at term .  Patient had an labor course complicated by: None Details of delivery can be found in separate delivery note.  Patient had a postpartum course complicated by postpartum hemorrhage, for which she was given 1 unit of pRBCs. Patient is discharged home 11/29/22.  Newborn Weight:   DiKorrin, [id number]    DiDejanee, [id number] g   Magnesium Sulfate received: No BMZ received: No Rhophylac:N/A MMR:N/A, Immune T-DaP: offered inpatient prior to discharge Flu: Given prenatally Transfusion:Yes  Physical exam  Vitals:   11/28/22 0400 11/28/22 1400 11/28/22 2016 11/29/22 0506  BP: 119/70 120/74 127/67  118/69  Pulse: 85 78 70 76  Resp: '18 16 18 18  '$ Temp: 98.8 F (37.1 C) 98 F (36.7 C) 98.2 F (36.8 C) 97.9 F (36.6 C)  TempSrc: Oral Oral Oral Oral  SpO2: 98%  97% 100%  Weight:      Height:       General: alert, cooperative, and no distress Lochia: appropriate Uterine Fundus: firm Incision: N/A DVT Evaluation: No evidence of DVT seen on physical exam. Calf/Ankle edema is present Labs: Lab Results  Component Value Date   WBC 7.6 11/27/2022   HGB 9.7 (L) 11/27/2022   HCT 27.4 (L) 11/27/2022   MCV 91.3 11/27/2022   PLT 224 11/27/2022      Latest Ref Rng & Units 10/01/2022    5:24 AM  CMP  Glucose 70 - 99 mg/dL 86   BUN 6 - 20 mg/dL <5   Creatinine 0.44 - 1.00 mg/dL 0.51   Sodium 135 - 145 mmol/L 135   Potassium 3.5 - 5.1 mmol/L 3.6   Chloride 98 - 111 mmol/L 106   CO2 22 - 32 mmol/L 21   Calcium 8.9 - 10.3 mg/dL 8.1   Total Protein 6.5 - 8.1 g/dL 5.9   Total Bilirubin 0.3 - 1.2 mg/dL 0.7   Alkaline Phos 38 - 126 U/L 139   AST 15 - 41 U/L 16   ALT 0 - 44 U/L 10    Edinburgh Score:    11/27/2022    7:17 PM  EdFlavia Shipper  Postnatal Depression Scale Screening Tool  I have been able to laugh and see the funny side of things. 0  I have looked forward with enjoyment to things. 0  I have blamed myself unnecessarily when things went wrong. 1  I have been anxious or worried for no good reason. 2  I have felt scared or panicky for no good reason. 0  Things have been getting on top of me. 1  I have been so unhappy that I have had difficulty sleeping. 0  I have felt sad or miserable. 0  I have been so unhappy that I have been crying. 0  The thought of harming myself has occurred to me. 0  Edinburgh Postnatal Depression Scale Total 4     After visit meds:  Allergies as of 11/29/2022   No Known Allergies      Medication List     STOP taking these medications    aspirin 81 MG chewable tablet       TAKE these medications    acetaminophen 325 MG  tablet Commonly known as: Tylenol Take 2 tablets (650 mg total) by mouth every 4 (four) hours as needed (for pain scale < 4).   acyclovir 400 MG tablet Commonly known as: ZOVIRAX Take 1 tablet (400 mg total) by mouth 3 (three) times daily.   Blood Pressure Monitor Misc For regular home bp monitoring during pregnancy   buprenorphine 8 MG Subl SL tablet Commonly known as: SUBUTEX Place 8 mg under the tongue 2 (two) times daily.   ferrous sulfate 325 (65 FE) MG tablet Take 1 tablet (325 mg total) by mouth every other day.   furosemide 20 MG tablet Commonly known as: LASIX Take 1 tablet (20 mg total) by mouth 2 (two) times daily for 5 days.   ibuprofen 600 MG tablet Commonly known as: ADVIL Take 1 tablet (600 mg total) by mouth every 6 (six) hours.   prenatal vitamin w/FE, FA 27-1 MG Tabs tablet Take 1 tablet by mouth daily at 12 noon.         Discharge home in stable condition Infant Feeding: Bottle Infant Disposition:home with mother Discharge instruction: per After Visit Summary and Postpartum booklet. Activity: Advance as tolerated. Pelvic rest for 6 weeks.  Diet: routine diet Future Appointments: Future Appointments  Date Time Provider [place]  01/02/2023  1:50 PM Cresenzo-Dishmon, Joaquim Lai, CNM [facility]   Follow up Visit:  The following message was sent to FT by Mikki Santee, MD  Please schedule this patient for a In person postpartum visit in 6 weeks with the following provider: MD or APP. Additional Postpartum F/U: None   High risk pregnancy complicated by:  Di/di twin pregnancy Anticipated Birth Control:  IUD  Procedure to be performed prior to discharge: IV Venofer infusion  Mallie Snooks, [place], MSN, CNM Certified Nurse Midwife, Product/process development scientist for [employer], [place]

## 2022-11-28 ENCOUNTER — Other Ambulatory Visit: Payer: Medicaid Other

## 2022-11-28 ENCOUNTER — Encounter: Payer: Medicaid Other | Admitting: Obstetrics & Gynecology

## 2022-11-28 LAB — BPAM RBC
Blood Product Expiration Date: 202403122359
ISSUE DATE / TIME: 202402112041
Unit Type and Rh: 5100

## 2022-11-28 LAB — TYPE AND SCREEN
ABO/RH(D): O POS
Antibody Screen: NEGATIVE
Unit division: 0

## 2022-11-28 LAB — CBC
HCT: 26.8 % — ABNORMAL LOW (ref 36.0–46.0)
Hemoglobin: 9.7 g/dL — ABNORMAL LOW (ref 12.0–15.0)
MCH: 32.3 pg (ref 26.0–34.0)
MCHC: 36.2 g/dL — ABNORMAL HIGH (ref 30.0–36.0)
MCV: 89.3 fL (ref 80.0–100.0)
Platelets: 218 10*3/uL (ref 150–400)
RBC: 3 MIL/uL — ABNORMAL LOW (ref 3.87–5.11)
RDW: 14.3 % (ref 11.5–15.5)
WBC: 17.8 10*3/uL — ABNORMAL HIGH (ref 4.0–10.5)
nRBC: 0.1 % (ref 0.0–0.2)

## 2022-11-28 MED ORDER — FERROUS SULFATE 325 (65 FE) MG PO TABS
325.0000 mg | ORAL_TABLET | Freq: Every day | ORAL | Status: DC
  Administered 2022-11-28 – 2022-11-29 (×2): 325 mg via ORAL
  Filled 2022-11-28 (×2): qty 1

## 2022-11-28 NOTE — Lactation Note (Signed)
This note was copied from a baby's chart. Lactation Consultation Note  Patient Name: Rahwa Baz [id number] Date: 11/28/2022 Reason for consult: Follow-up assessment;Multiple gestation;Early term 37-38.6wks Age:34 hours Mom stated she has only been formula feeding today and hasn't used the DEBP yet but is going to use it tonight. LC reviewed how to use it again. Mom stated she spoke w/WIC today and they got the referral I sent. Mom plans to rent a pump from [facility]. LC reviewed pumping and milk storage and increasing baby's formula intake. Answered questions mom had. Encouraged to call for assistance when needed.  Maternal Data    Feeding Mother's Current Feeding Choice: Breast Milk and Formula Nipple Type: Extra Slow Flow  LATCH Score                    Lactation Tools Discussed/Used Tools: Shells;Pump  Interventions    Discharge    Consult Status Consult Status: Follow-up Date: 11/29/22 Follow-up type: In-patient    Theodoro Kalata 11/28/2022, 9:29 PM

## 2022-11-28 NOTE — Clinical Social Work Maternal (Signed)
CLINICAL SOCIAL WORK MATERNAL/CHILD NOTE  Patient Details  Name: Brandy Farrell MRN: [id number] Date of Birth: 10-28-1990  Date:  11/28/2022  Clinical Social Worker Initiating Note:  Brandy Farrell Brandy Farrell Date/Time: Initiated:  11/28/22/1140     Child's Name:  Brandy Farrell 11/27/2012   Biological Parents:  Mother, Father Brandy Farrell 01-20-1991, Brandy Farrell)   Need for Interpreter:  None   Reason for Referral:  Other (Comment) (Hx of narcotic addiction)   Address:  [address] [place] [address]    Phone number:  [phone] (home)     Additional phone number:   Household Members/Support Persons (HM/SP):   Household Member/Support Person 1, Household Member/Support Person 2   HM/SP Name Relationship DOB or Age  HM/SP -[place] FOB unknown  HM/SP -2 Brandy Farrell son 04/06/2007  HM/SP -3        HM/SP -4        HM/SP -5        HM/SP -6        HM/SP -7        HM/SP -8          Natural Supports (not living in the home):  Extended Family, Immediate Family   Professional Supports: Therapist   Employment: Unemployed   Type of Work:     Education:  Nurse, adult   Homebound arranged:    Museum/gallery curator Resources:  Medicaid   Other Resources:  Physicist, medical  , [place] Considerations Which May Impact Care:    Strengths:  Ability to meet basic needs  , Home prepared for child  , Pediatrician chosen   Psychotropic Medications:         Pediatrician:    [facility]  Pediatrician List:   [place]      Pediatrician Fax Number:    Risk Factors/Current Problems:  None   Cognitive State:  Able to Concentrate  , Alert     Mood/Affect:  Calm  , Interested     CSW Assessment: CSW received consult for hx of narcotic addiction and MOB currently on Subutex. CSW met with MOB to complete  assessment and offer support. CSW entered the room introduced self, CSW role and reason for visit. MOB was agreeable to visit. CSW inquired about how MOB was feeling MOB reported alright. CSW confirmed MOB address and phone number, MOB reported the address and phone number on file are correct. CSW inquired about any MH hx, MOB denied any MH history but reported she sees a therapist and a doctor for med management at [facility]. MOB reported she is currently prescribed Subutex. CSW assessed for safety, MOB denied any SI or HI. MOB identified her sisters, mom and MIL as her supports. CSW provided education regarding the baby blues period vs. perinatal mood disorders, discussed treatment and gave resources for mental health follow up if concerns arise.  CSW recommends self-evaluation during the postpartum time period using the New Mom Checklist from Postpartum Progress and encouraged MOB to contact a medical professional if symptoms are noted at any time.  CSW provided review of Sudden Infant Death Syndrome (SIDS) precautions. MOB reported she has all necessary items for the infant including a crib and bassinet for both babies. MOB identified [facility] for infants follow up care.   CSW explained the hospital drug screen  policy, MOB voiced understanding. CSW explained a CPS notification would be made in regards to infant UDS or CDS, MOB voiced understanding.  CSW identifies no further need for intervention and no barriers to discharge at this time.  CSW Plan/Description:  No Further Intervention Required/No Barriers to Discharge, Sudden Infant Death Syndrome (SIDS) Education, Perinatal Mood and Anxiety Disorder (PMADs) Education, [place], CSW Will Continue to Monitor Umbilical Cord Tissue Drug Screen Results and Make Report if Brandy Caprice, LCSW 11/28/2022, 11:44 AM

## 2022-11-28 NOTE — Lactation Note (Signed)
This note was copied from a baby's chart. Lactation Consultation Note  Patient Name: Despina Luff [id number] Date: 11/28/2022 Reason for consult: Initial assessment;Early term 37-38.6wks;Multiple gestation;Infant < 6lbs Age:34 hours FOB holding baby STS after bath. Mom has been giving formula. One twin has latched and one hasn't. Babies had fed on formula before bath. Both are sleeping soundly. Mom is going to pump. Milk storage reviewed and discussed supplementing w/her milk first before formula and not to mix. Mom states understanding. Encouraged mom to call for assistance today and tonight if needed.  Maternal Data Has patient been taught Hand Expression?: Yes Does the patient have breastfeeding experience prior to this delivery?: No  Feeding Nipple Type: Extra Slow Flow  LATCH Score                    Lactation Tools Discussed/Used    Interventions Interventions: DEBP;LC Services brochure  Discharge    Consult Status Consult Status: Follow-up Date: 11/28/22 Follow-up type: In-patient    Theodoro Kalata 11/28/2022, 2:35 AM

## 2022-11-28 NOTE — Lactation Note (Addendum)
This note was copied from a baby's chart. Lactation Consultation Note  Patient Name: Brandy Farrell [id number] Date: 11/28/2022 Reason for consult: Initial assessment;Early term 37-38.6wks;Multiple gestation Age:34 hours Mom holding baby STS after bath and FOB holding other twin STS after bath.  Babies has eaten before bath. Discussed feeding positions. Attempted to latch baby but to sleepy. Newborn feeding habits, STS, I&O, feeding one twin at a time reviewed. Discussed pumping w/mom and supplementing w/EBM and formula until mature milk supply comes fully in. Mom doesn't have a DEBP. [facility] referral made.  Mom shown how to use DEBP & how to disassemble, clean, & reassemble parts. Mom encouraged to feed baby 8-12 times/24 hours and with feeding cues.  Encouraged mom to call for assistance or questions. LC took shells to room but mom will not wear a bra today, so LC will show mom about shells on LC next shift for her to wear the next day. To evert short compressible shaft more, especially when her milk comes in she might need the shells.  "My Care Plan" crib cards placed in crib for each baby and reviewed w/mom. Maternal Data Has patient been taught Hand Expression?: Yes Does the patient have breastfeeding experience prior to this delivery?: No  Feeding Nipple Type: Extra Slow Flow  LATCH Score Latch: Too sleepy or reluctant, no latch achieved, no sucking elicited.  Audible Swallowing: None  Type of Nipple: Everted at rest and after stimulation  Comfort (Breast/Nipple): Soft / non-tender  Hold (Positioning): Full assist, staff holds infant at breast  LATCH Score: 4   Lactation Tools Discussed/Used Tools: Pump Breast pump type: Double-Electric Breast Pump Pump Education: Setup, frequency, and cleaning;Milk Storage Reason for Pumping: ETI/twins Pumping frequency: q3h  Interventions Interventions: Breast feeding basics reviewed;Adjust position;DEBP;Assisted with  latch;Support pillows;Skin to skin;Position options;Breast massage;Hand express;Breast compression;LC Services brochure  Discharge Pump: DEBP WIC Program: Yes  Consult Status Consult Status: Follow-up Date: 11/28/22 Follow-up type: In-patient    Theodoro Kalata 11/28/2022, 2:24 AM

## 2022-11-28 NOTE — Progress Notes (Signed)
POSTPARTUM PROGRESS NOTE  Post Partum Day 1  Subjective:  Brandy Farrell is a 31 y.o. G2P2003 s/p VD at [redacted]w[redacted]d  She reports she is doing well. No acute events overnight. She denies any problems with ambulating, voiding or po intake. Denies nausea or vomiting.  Pain is well controlled.  Lochia is appropriate.  Objective: Blood pressure 119/70, pulse 85, temperature 98.8 F (37.1 C), temperature source Oral, resp. rate 18, height 5' 6"$  (1.676 m), weight 110.4 kg, last menstrual period 03/11/2022, SpO2 98 %, unknown if currently breastfeeding.  Physical Exam:  General: alert, cooperative and no distress Chest: no respiratory distress Heart:regular rate, distal pulses intact Abdomen: soft, nontender,  Uterine Fundus: firm, appropriately tender DVT Evaluation: No calf swelling or tenderness Extremities: No LE edema Skin: warm, dry  Recent Labs    11/27/22 0122 11/28/22 0534  HGB 9.7* 9.7*  HCT 27.4* 26.8*    Assessment/Plan: Brandy KASALis a 32y.o. G2P2003 s/p VD at 364w2dith didi twins.  PPD#1 - Doing well  Routine postpartum care Contraception: IUD Feeding: Both Dispo: Plan for discharge 2/13.  9.7 s/p 1 uPRCS.  -Start oral iron -AM CBC   LOS: 1 day   Brandy Farrell OB Fellow, [place]/09/2023, 7:21 AM

## 2022-11-29 LAB — CBC
HCT: 23.2 % — ABNORMAL LOW (ref 36.0–46.0)
Hemoglobin: 8.5 g/dL — ABNORMAL LOW (ref 12.0–15.0)
MCH: 33.2 pg (ref 26.0–34.0)
MCHC: 36.6 g/dL — ABNORMAL HIGH (ref 30.0–36.0)
MCV: 90.6 fL (ref 80.0–100.0)
Platelets: 235 10*3/uL (ref 150–400)
RBC: 2.56 MIL/uL — ABNORMAL LOW (ref 3.87–5.11)
RDW: 14.6 % (ref 11.5–15.5)
WBC: 12.4 10*3/uL — ABNORMAL HIGH (ref 4.0–10.5)
nRBC: 0 % (ref 0.0–0.2)

## 2022-11-29 LAB — SURGICAL PATHOLOGY

## 2022-11-29 MED ORDER — SODIUM CHLORIDE 0.9 % IV SOLN: Freq: Once | INTRAVENOUS | Status: AC

## 2022-11-29 MED ORDER — ACETAMINOPHEN 325 MG PO TABS: 650.0000 mg | ORAL_TABLET | ORAL | 0 refills | Status: AC | PRN

## 2022-11-29 MED ORDER — FUROSEMIDE 20 MG PO TABS: 20.0000 mg | ORAL_TABLET | Freq: Two times a day (BID) | ORAL | 0 refills | Status: DC

## 2022-11-29 MED ORDER — SODIUM CHLORIDE 0.9 % IV SOLN
500.0000 mg | Freq: Once | INTRAVENOUS | Status: AC
  Administered 2022-11-29: 500 mg via INTRAVENOUS
  Filled 2022-11-29: qty 25

## 2022-11-29 MED ORDER — IBUPROFEN 600 MG PO TABS: 600.0000 mg | ORAL_TABLET | Freq: Four times a day (QID) | ORAL | 0 refills | Status: DC

## 2022-11-29 NOTE — Lactation Note (Signed)
This note was copied from a baby's chart. Lactation Consultation Note  Patient Name: Kathlyn Pafford [id number] Date: 11/29/2022  Reason for consult: Follow-up assessment;Early term 37-38.6wks  Age:32 years P3, Twins, 37.2 GA  Upon entry to room, mother was bottle feeding baby. She reports she tried to latch the boys once today without success. Infants are making improvement with formula bottle feeding.   Mother states she is going to start pumping today. Reviewed pumping information. Encouraged to pump for 15 minutes, every 3 hours or often as she can. Mother states WIC has contacted her and she is eligible to get a DEBP from [facility] which she plans to pick it up on her way room.   Mother instructed to call [place] if she needs assistance with pumping.   Maternal Data Mother takes Subutex po daily. Infants have an extended stay for eat, sleep, console.   Feeding Mother's Current Feeding Choice: Breast Milk and Formula Nipple Type: Dr. Myra Gianotti Preemie   Lactation Tools Discussed/Used  DEBP  Interventions Interventions: Education  Consult Status Consult Status: Follow-up Date: 11/30/22 Follow-up type: In-patient   Stana Bunting M 11/29/2022, 5:35 PM

## 2022-11-29 NOTE — Discharge Instructions (Signed)

## 2022-12-01 ENCOUNTER — Other Ambulatory Visit: Payer: Medicaid Other

## 2022-12-01 ENCOUNTER — Ambulatory Visit: Payer: Self-pay

## 2022-12-01 NOTE — Lactation Note (Signed)
This note was copied from a baby's chart. Lactation Consultation Note  Patient Name: Brandy Farrell Date: 12/01/2022   Age:34 days  LC in to visit with [name] of ET twin babies delivered vaginally.  FOB bottle feeding one twin.  Mom not in room as FOB stated Mom has left to go outside.    Both babies are formula feeding and taking more volume.  Baby A is at a 4% weight loss and baby B is at a 6% weight loss.    LC noted DEBP at bedside.  FOB said Mom hasn't been pumping, but she "tried it once and didn't get anything".  FOB asked to have Mom call out to desk when she returns and ask for lactation consultant.     [name]  RN IBCLC 12/01/2022, 12:57 PM

## 2022-12-01 NOTE — Lactation Note (Signed)
This note was copied from a baby's chart. Lactation Consultation Note  Patient Name: Brandy Farrell [id number] Date: 12/01/2022 Reason for consult: Follow-up assessment;Early term 37-38.6wks;Infant < 6lbs;Other (Comment);Multiple gestation;1st time breastfeeding (ESC, Mom on Subutex for SUD) Age:34 days  LC visited with [name] of Early Term twins.  Both babies are primarily bottle feeding formula,  Baby A is at 3.5% weight loss and Baby B is at a 5.9% loss.    Mom reports she just pumped and expressed 10 ml which she fed to babies.  Both babies are asleep and Mom very lethargic.  Mom reports she hasn't pumped because she doesn't have any milk.  Reviewed the importance of frequent pumping to "place an order" for her milk to come to volume.  Explained about milk producing hormones and stimulation needed to support a full milk supply.  Mom needed LC to repeat this information before she appeared to understand.    LC disassembled all the pump and bottle parts, washed, rinsed and placed in separate bin for drying on other side of splash guard.  Mom taught and reviewed information on pump part cleaning.  Mom noted to be sleeping.  Baby taken from her and placed in crib swaddled tightly. Reminded Mom of importance of not falling asleep with babies, to settled them in crib safely before she falls asleep.  Offered to assist with latching babies at next feeding and Mom questioned why she would do that if she didn't have much milk.  Talked again about colostrum and the benefits of babies being STS and latching to the breast.  Mom to ask for help prn.  Lactation Tools Discussed/Used Tools: Pump;Flanges;Bottle;Shells Flange Size: 24 Breast pump type: Double-Electric Breast Pump;Manual Pump Education: Setup, frequency, and cleaning;Milk Storage Reason for Pumping: Support milk supply/twins ET/ ESC Pumping frequency: Mom has pumped a few times, encouraged to pump both breasts on initiation setting  every 3 hrs Pumped volume: 10 mL  Interventions Interventions: Breast feeding basics reviewed;Skin to skin;Breast massage;Hand express;DEBP;Expressed milk;Pace feeding  Consult Status Consult Status: Follow-up Date: 12/02/22 Follow-up type: In-patient    [name] 12/01/2022, 3:36 PM

## 2022-12-02 ENCOUNTER — Ambulatory Visit: Payer: Self-pay

## 2022-12-02 NOTE — Lactation Note (Signed)
This note was copied from a baby's chart. Lactation Consultation Note  Patient Name: Brandy Farrell [id number] Date: 12/02/2022 Reason for consult: Follow-up assessment;Multiple gestation;Infant weight loss (Baby B: 7.25% WL; Baby A 3.71% WL) Age:34 days  LC entered the room and the infant's were out being circumcised.  Per the birth parent things have been going well with pumping and breastfeeding.  She stated that she had no concerns.  LC answered questions about pumping.  Wren educated the birth parent on engorgement, breast care, feeding frequency, pumping frequency, mastitis, infant I/O, and outpatient services.  The parents had no further concerns.   Infant Feeding Plan:  Breastfeed the infant's according to feeding cues 8+ times in 24 hours.  Put the infant's to the breast prior to supplementing.  Supplement the infant's according to supplementation guidelines.  Pump and feed expressed milk to the infants.  Watch infant output and call the pediatrician with questions or concerns.  Call outpatient [facility] for assistance with breastfeeding.   Interventions Interventions: Education  Discharge Discharge Education: Engorgement and breast care;Warning signs for feeding baby;Outpatient recommendation  Consult Status Consult Status: Complete Date: 12/02/22 Follow-up type: Call as needed    Lysbeth Penner 12/02/2022, 4:38 PM

## 2022-12-05 ENCOUNTER — Other Ambulatory Visit: Payer: Medicaid Other

## 2022-12-08 ENCOUNTER — Telehealth (HOSPITAL_COMMUNITY): Payer: Self-pay | Admitting: *Deleted

## 2022-12-08 NOTE — Telephone Encounter (Signed)
Left phone voicemail message.  Odis Hollingshead, RN 12-08-2022 at 1:34pm

## 2023-01-02 ENCOUNTER — Ambulatory Visit: Payer: Medicaid Other | Admitting: Advanced Practice Midwife

## 2023-01-30 ENCOUNTER — Encounter: Payer: Self-pay | Admitting: Women's Health

## 2023-01-30 ENCOUNTER — Ambulatory Visit (INDEPENDENT_AMBULATORY_CARE_PROVIDER_SITE_OTHER): Payer: Medicaid Other | Admitting: Women's Health

## 2023-01-30 DIAGNOSIS — Z8759 Personal history of other complications of pregnancy, childbirth and the puerperium: Secondary | ICD-10-CM

## 2023-01-30 DIAGNOSIS — O165 Unspecified maternal hypertension, complicating the puerperium: Secondary | ICD-10-CM | POA: Insufficient documentation

## 2023-01-30 DIAGNOSIS — Z30013 Encounter for initial prescription of injectable contraceptive: Secondary | ICD-10-CM | POA: Diagnosis not present

## 2023-01-30 DIAGNOSIS — Z3202 Encounter for pregnancy test, result negative: Secondary | ICD-10-CM

## 2023-01-30 LAB — POCT HEMOGLOBIN: Hemoglobin: 10.8 g/dL — AB (ref 11–14.6)

## 2023-01-30 LAB — POCT URINE PREGNANCY: Preg Test, Ur: NEGATIVE

## 2023-01-30 MED ORDER — MEDROXYPROGESTERONE ACETATE 150 MG/ML IM SUSY
150.0000 mg | PREFILLED_SYRINGE | Freq: Once | INTRAMUSCULAR | Status: AC
  Administered 2023-01-30: 150 mg via INTRAMUSCULAR

## 2023-01-30 MED ORDER — AMLODIPINE BESYLATE 5 MG PO TABS: 5.0000 mg | ORAL_TABLET | Freq: Every day | ORAL | 0 refills | Status: DC

## 2023-01-30 NOTE — Progress Notes (Signed)
POSTPARTUM VISIT Patient name: Brandy Farrell MRN [id number]  Date of birth: 28-Mar-1991 Chief Complaint:   Postpartum Care (Want birth control, sex 2 day ago)  History of Present Illness:   Brandy Farrell is a 32 y.o. G16P2003 African American female being seen today for a postpartum visit. She is 9 weeks postpartum following a spontaneous vaginal delivery at 37.2 gestational weeks. IOL: yes, for multiple gestation . Anesthesia: epidural.  Laceration: 1st degree, no repair.  Complications: PPH cord avulsion baby b w/ manual extraction placenta, IM methergine and 1u PRBC. Inpatient contraception: no.   Pregnancy complicated by DCDA twins, subutex therapy . Tobacco use: former . 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 On subutex from [facility] Last pap smear: 09/14/21 and results were NILM w/ HRHPV negative. Next pap smear due: 2025 No LMP recorded.  Postpartum course has been uncomplicated. Bleeding none. Bowel function is normal. Bladder function is normal. Urinary incontinence? no, fecal incontinence? no Patient is sexually active. Last sexual activity:  2d ago, no condoms or w/drawal . Desired contraception: Depo. Patient does not want a pregnancy in the future.  Desired family size is 3 children.   Upstream - 01/30/23 1555       Pregnancy Intention Screening   Does the patient want to become pregnant in the next year? No    Does the patient's partner want to become pregnant in the next year? No    Would the patient like to discuss contraceptive options today? No      Contraception Wrap Up   Current Method No Contraceptive Precautions    End Method No Contraception Precautions    Contraception Counseling Provided Yes            The pregnancy intention screening data noted above was reviewed. Potential methods of contraception were discussed. The patient elected to proceed with No Contraception Precautions.  Edinburgh Postpartum Depression Screening:  negative  Edinburgh Postnatal Depression Scale - 01/30/23 1550       Edinburgh Postnatal Depression Scale:  In the Past 7 Days   I have been able to laugh and see the funny side of things. 0    I have looked forward with enjoyment to things. 0    I have blamed myself unnecessarily when things went wrong. 0    I have been anxious or worried for no good reason. 1    I have felt scared or panicky for no good reason. 0    Things have been getting on top of me. 0    I have been so unhappy that I have had difficulty sleeping. 0    I have felt sad or miserable. 0    I have been so unhappy that I have been crying. 0    The thought of harming myself has occurred to me. 0    Edinburgh Postnatal Depression Scale Total 1                06/03/2022   10:24 AM 06/03/2022   10:08 AM 09/14/2021    9:14 AM  GAD 7 : Generalized Anxiety Score  Nervous, Anxious, on Edge 0 0 0  Control/stop worrying 0 0 0  Worry too much - different things 0 0 0  Trouble relaxing 0 0 0  Restless 0 0 0  Easily annoyed or irritable 0 0 0  Afraid - awful might happen 0 0 0  Total GAD 7 Score 0 0 0  Baby's course has been uncomplicated. Baby is feeding by bottle. Infant has a pediatrician/family doctor? Yes.  Childcare strategy if returning to work/school: n/a-stay at home mom.  Pt has material needs met for her and baby: Yes.   Review of Systems:   Pertinent items are noted in HPI Denies Abnormal vaginal discharge w/ itching/odor/irritation, headaches, visual changes, shortness of breath, chest pain, abdominal pain, severe nausea/vomiting, or problems with urination or bowel movements. Pertinent History Reviewed:  Reviewed past medical,surgical, obstetrical and family history.  Reviewed problem list, medications and allergies. OB History  Gravida Para Term Preterm AB Living  2 2 2     3   SAB IAB Ectopic Multiple Live Births        1 3    # Outcome Date GA Lbr Len/2nd Weight Sex Delivery Anes PTL Lv  2A  Term 11/27/22 [redacted]w[redacted]d 06:32 / 05:29 6 lb 3.8 oz (2.83 kg) M Vag-Spont EPI  LIV  2B Term 11/27/22 [redacted]w[redacted]d 06:32 / 05:39 5 lb 12.4 oz (2.62 kg) M Vag-Spont EPI  LIV  1 Term 04/06/07 [redacted]w[redacted]d  6 lb 14 oz (3.118 kg) M Vag-Spont EPI N LIV   Physical Assessment:   Vitals:   01/30/23 1500 01/30/23 1600  BP: (!) 139/90 (!) 147/88  Pulse: 67   Weight: 180 lb (81.6 kg)   Body mass index is 29.05 kg/m.       Physical Examination:   General appearance: alert, well appearing, and in no distress  Mental status: alert, oriented to person, place, and time  Skin: warm & dry   Cardiovascular: normal heart rate noted   Respiratory: normal respiratory effort, no distress   Breasts: deferred, no complaints   Abdomen: soft, non-tender   Pelvic: examination not indicated. Thin prep pap obtained: No  Rectal: not examined  Extremities: Edema: none   Chaperone: N/A         Results for orders placed or performed in visit on 01/30/23 (from the past 24 hour(s))  POCT urine pregnancy   Collection Time: 01/30/23  4:38 PM  Result Value Ref Range   Preg Test, Ur Negative Negative  POCT hemoglobin   Collection Time: 01/30/23  4:42 PM  Result Value Ref Range   Hemoglobin 10.8 (A) 11 - 14.6 g/dL    Assessment & Plan:  1) Postpartum exam 2) 9 wks s/p spontaneous vaginal delivery of DCDA twins w/ PPH 3) bottle feeding 4) Depression screening 5) Contraception management: 1st depo today (had unprotected sex 2d ago, discussed if pregnant won't show yet, but depo also won't harm pregnancy), rx to pharmacy, condoms x 2wks, f/u for next dose  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  Mood and well being:  If positive depression screen, discussed and plan developed.  If using tobacco  we discussed reduction/cessation and risk of relapse If current substance abuse, we discussed and referral to local resources was offered.   2. Infant care and feeding:  If breastfeeding, discussed returning to work, pumping, breastfeeding-associated pain, guidance regarding return to fertility while lactating if not using another method. If needed, patient was provided with a letter to be allowed to pump q 2-3hrs to support lactation in a private location with access to a refrigerator to store breastmilk.  Recommended that all caregivers be immunized for flu, pertussis and other preventable communicable diseases If pt does not have material needs met for her/baby, referred to local resources for help obtaining these.  3. Sexuality, contraception and birth spacing Provided guidance regarding sexuality, management of dyspareunia, and resumption of intercourse Discussed avoiding interpregnancy interval <29mths and recommended birth spacing of 18 months  4. Sleep and fatigue Discussed coping options for fatigue and sleep disruption Encouraged family/partner/community support of 4 hrs of uninterrupted sleep to help with mood and fatigue  5. Physical recovery  If pt had a C/S, assessed incisional pain and providing guidance on normal vs prolonged recovery If pt had a laceration, perineal healing and pain reviewed.  If urinary or fecal incontinence, discussed management and referred to PT or uro/gyn if indicated  Patient is safe to resume physical activity. Discussed attainment of healthy weight.  6.  Chronic disease management Discussed pregnancy complications if any, and their implications for future childbearing and long-term maternal health. Review recommendations for prevention of recurrent pregnancy complications, such as 17 hydroxyprogesterone caproate to reduce risk for recurrent PTB not applicable, or aspirin to reduce risk of preeclampsia not applicable. Pt had GDM: no. If yes, 2hr GTT  scheduled: not applicable. Reviewed medications and non-pregnant dosing including consideration of whether pt is breastfeeding using a reliable resource such as LactMed: not applicable Referred for f/u w/ PCP or subspecialist providers as indicated: not applicable  7. Health maintenance Mammogram at 34yo or earlier if indicated Pap smears as indicated  Meds:  Meds ordered this encounter  Medications   amLODipine (NORVASC) 5 MG tablet    Sig: Take 1 tablet (5 mg total) by mouth daily.    Dispense:  30 tablet    Refill:  0   medroxyPROGESTERone Acetate SUSY 150 mg    Follow-up: Return in about 1 week (around 02/06/2023) for nurse bp check; then , 11-13wks for Depo injection.   Orders Placed This Encounter  Procedures   POCT urine pregnancy   POCT hemoglobin    Cheral Marker CNM, [facility] 01/30/2023 4:53 PM

## 2023-01-30 NOTE — Patient Instructions (Signed)

## 2023-02-06 ENCOUNTER — Ambulatory Visit: Payer: Medicaid Other

## 2023-04-24 ENCOUNTER — Ambulatory Visit: Payer: MEDICAID

## 2023-04-24 DIAGNOSIS — Z3042 Encounter for surveillance of injectable contraceptive: Secondary | ICD-10-CM | POA: Insufficient documentation

## 2023-06-29 ENCOUNTER — Ambulatory Visit: Payer: MEDICAID | Admitting: Women's Health

## 2023-11-27 ENCOUNTER — Encounter: Payer: Self-pay | Admitting: Women's Health

## 2023-11-27 ENCOUNTER — Ambulatory Visit (INDEPENDENT_AMBULATORY_CARE_PROVIDER_SITE_OTHER): Payer: MEDICAID | Admitting: Women's Health

## 2023-11-27 VITALS — BP 169/107 | HR 75 | Ht 66.0 in | Wt 232.0 lb

## 2023-11-27 DIAGNOSIS — Z3202 Encounter for pregnancy test, result negative: Secondary | ICD-10-CM | POA: Diagnosis not present

## 2023-11-27 DIAGNOSIS — Z30019 Encounter for initial prescription of contraceptives, unspecified: Secondary | ICD-10-CM | POA: Diagnosis not present

## 2023-11-27 DIAGNOSIS — I10 Essential (primary) hypertension: Secondary | ICD-10-CM | POA: Diagnosis not present

## 2023-11-27 LAB — POCT URINE PREGNANCY: Preg Test, Ur: NEGATIVE

## 2023-11-27 MED ORDER — PHEXXI 1.8-1-0.4 % VA GEL: 1.0000 | Freq: Once | VAGINAL | 11 refills | Status: AC

## 2023-11-27 MED ORDER — AMLODIPINE BESYLATE 5 MG PO TABS: 5.0000 mg | ORAL_TABLET | Freq: Every day | ORAL | 0 refills | Status: DC

## 2023-11-27 NOTE — Progress Notes (Signed)
 GYN VISIT Patient name: Brandy Farrell MRN [id number]  Date of birth: Jan 04, 1991 Chief Complaint:   Contraception  History of Present Illness:   Brandy Farrell is a 33 y.o. G48P2003 African-American female 15yr s/p SVB of twins being seen today for contraception counseling. Doesn't want anymore babies. Considering BTS, but a little scared of surgery, but still may do it. Did 1 dose of depo, but gained weight and hair was falling out.   Wants something natural. Discussed Phexxi  and Paragard, wants Phexxi .  H/O PPHTN, rx'd norvasc  5mg  on 01/30/23 at pp visit and was supposed to return 1wk later for bp check, never did. Never started norvasc , thought bp would get better on its own. PCP Dr. Marykay Snipes in [place].    Patient's last menstrual period was 11/01/2023. The current method of family planning is coitus interruptus.  Last pap 09/14/21. Results were: NILM w/ HRHPV negative     06/03/2022   10:20 AM 06/03/2022   10:08 AM 09/14/2021    9:14 AM 01/09/2018    3:21 PM  Depression screen PHQ 2/9  Decreased Interest 1 1 0 1  Down, Depressed, Hopeless 0 0 0   PHQ - 2 Score 1 1 0 1  Altered sleeping 2 2 0 1  Tired, decreased energy 2 2 0 1  Change in appetite 0 0 0 3  Feeling bad or failure about yourself  0 0 0   Trouble concentrating 0 0 0 0  Moving slowly or fidgety/restless 0 0 0 0  Suicidal thoughts 0 0 0 0  PHQ-9 Score 5 5 0 6  Difficult doing work/chores    Somewhat difficult        06/03/2022   10:24 AM 06/03/2022   10:08 AM 09/14/2021    9:14 AM  GAD 7 : Generalized Anxiety Score  Nervous, Anxious, on Edge 0 0 0  Control/stop worrying 0 0 0  Worry too much - different things 0 0 0  Trouble relaxing 0 0 0  Restless 0 0 0  Easily annoyed or irritable 0 0 0  Afraid - awful might happen 0 0 0  Total GAD 7 Score 0 0 0     Review of Systems:   Pertinent items are noted in HPI Denies fever/chills, dizziness, headaches, visual disturbances, fatigue, shortness of breath,  chest pain, abdominal pain, vomiting, abnormal vaginal discharge/itching/odor/irritation, problems with periods, bowel movements, urination, or intercourse unless otherwise stated above.  Pertinent History Reviewed:  Reviewed past medical,surgical, social, obstetrical and family history.  Reviewed problem list, medications and allergies. Physical Assessment:   Vitals:   11/27/23 1508 11/27/23 1518  BP: (!) 179/97 (!) 169/107  Pulse: 88 75  Weight: 232 lb (105.2 kg)   Height: 5\' 6"  (1.676 m)   Body mass index is 37.45 kg/m.       Physical Examination:   General appearance: alert, well appearing, and in no distress  Mental status: alert, oriented to person, place, and time  Skin: warm & dry   Cardiovascular: normal heart rate noted  Respiratory: normal respiratory effort, no distress  Abdomen: soft, non-tender   Pelvic: examination not indicated  Extremities: no edema   Chaperone: N/A    No results found for this or any previous visit (from the past 24 hours).  Assessment & Plan:  1) Contraception management> rx Phexxi  to [facility], can use w/ condoms or w/drawal. F/U w/ MD to further discuss BTS/schedule if wants and f/u on bp  2) CHTN>  never started norvasc  rx'd 01/30/23 at pp visit, didn't return for bp f/u. Has PCP, to call them today for appt asap. Rx'd norvasc  5mg  today, to start asap.   Meds:  Meds ordered this encounter  Medications   Lactic Ac-Citric Ac-Pot Bitart (PHEXXI ) 1.8-1-0.4 % GEL    Sig: Place 1 Applicatorful vaginally once for 1 dose. Up to 1 hour before sex    Dispense:  180 g    Refill:  11   amLODipine  (NORVASC ) 5 MG tablet    Sig: Take 1 tablet (5 mg total) by mouth daily.    Dispense:  30 tablet    Refill:  0    No orders of the defined types were placed in this encounter.   Return in about 1 week (around 12/04/2023) for gyn w/ MD only.  Ferd Householder CNM, [facility] 11/27/2023 3:39 PM

## 2023-11-27 NOTE — Patient Instructions (Signed)
 Surgery to Take Out One or Both Fallopian Tubes (Salpingectomy): What to Expect  Salpingectomy is surgery to remove one or both of the fallopian tubes. The fallopian tubes connect the ovaries to the uterus. You may need this surgery if: A procedure is being done on your belly or on the area between the hips (pelvis), and the health care provider thinks that removing the tubes will lower your risk for cancer. You have an ectopic pregnancy. This is when a fertilized egg attaches to the fallopian tube instead of the uterus. An ectopic pregnancy can cause the tube to burst or tear. You don't want to have children (sterilization). In this case, both tubes will be removed. You have cancer of the fallopian tube or nearby organs. You're at high risk for cancer of the ovaries. Your tube and ovary have become twisted. There are two methods that can be used to remove the fallopian tubes: An open method. One large cut is made in your belly. A laparoscopic method. Several small cuts are made in your belly. A thin, lighted camera (laparoscope) and other instruments are used to remove the fallopian tubes. The provider may also use a robot to help in surgery. Tell a health care provider about: Any allergies you have. All medicines you take. These include vitamins, herbs, eye drops, and creams. Any problems you or family members have had with anesthesia. Any bleeding problems you have. Any surgeries you've had. Any medical conditions you have. Whether you're pregnant, may be pregnant, or wish to get pregnant. What are the risks? Your provider will talk with you about risks. These may include: Infection. Bleeding. Allergic reactions to medicines. Blood clots in the legs or lungs. Damage to nearby structures or organs. What happens before the surgery? When to stop eating and drinking Eat and drink only as you've been told. You may be told this: 8 hours before your surgery Stop eating most foods. Do not  eat meat, fried foods, or fatty foods. Eat only light foods, such as toast or crackers. All liquids are OK except energy drinks and alcohol. 6 hours before your surgery Stop eating. Drink only clear liquids, such as water, clear fruit juice, black coffee, plain tea, and sports drinks. Do not drink energy drinks or alcohol. 2 hours before your surgery Stop drinking all liquids. You may be allowed to take medicines with small sips of water. If you do not eat and drink as told, your surgery may be delayed or canceled. Medicines Ask about changing or stopping: Any medicines you take. Any vitamins, herbs, or supplements you take. Do not take aspirin or ibuprofen unless you're told to. Surgery safety For your safety, you may: Need to wash your skin with a soap that kills germs. Get antibiotics. Have your surgery site marked. Have hair removed at the surgery site. General instructions Ask if you'll be staying overnight in the hospital. If you'll be going home right after the surgery, plan to have a responsible adult: Drive you home from the hospital or clinic. You won't be allowed to drive. Stay with you for the time you're told. What happens during the surgery? An IV will be put into a vein in your hand or arm. You may be given: A sedative to help you relax. Anesthesia to keep you from feeling pain. A small, thin tube (catheter) may be inserted through your urethra and into your bladder. This will drain pee during your surgery. Depending on the type of surgery you're having, one cut  or several small cuts will be made in your belly. Your fallopian tube (or tubes) will be cut from the ovary and uterus and removed from your body. The cut or cuts in your belly will be closed with stitches, staples, skin glue, or tape strips. A bandage may be placed over your cut or cuts. These steps may vary. Ask what you can expect. What happens after the surgery?  You'll be watched closely until you  leave. This includes checking your pain level, blood pressure, heart rate, and breathing rate. You may have to wear compression stockings to reduce swelling and help prevent blood clots in your legs. You'll be given pain medicine as needed. This information is not intended to replace advice given to you by your health care provider. Make sure you discuss any questions you have with your health care provider. Document Revised: 05/15/2023 Document Reviewed: 05/15/2023 Elsevier Patient Education  2024 [employer].

## 2023-11-27 NOTE — Addendum Note (Signed)
 Addended by: Laurinda Porch A on: 11/27/2023 03:56 PM   Modules accepted: Orders

## 2023-12-05 ENCOUNTER — Ambulatory Visit: Payer: MEDICAID | Admitting: Obstetrics & Gynecology

## 2024-05-07 ENCOUNTER — Ambulatory Visit: Payer: MEDICAID | Admitting: Obstetrics & Gynecology

## 2024-05-07 ENCOUNTER — Other Ambulatory Visit (HOSPITAL_COMMUNITY)
Admission: RE | Admit: 2024-05-07 | Discharge: 2024-05-07 | Disposition: A | Discharge status: Home / Self Care | Payer: MEDICAID | Source: Ambulatory Visit | Attending: Obstetrics & Gynecology | Admitting: Obstetrics & Gynecology

## 2024-05-07 ENCOUNTER — Encounter: Payer: Self-pay | Admitting: Obstetrics & Gynecology

## 2024-05-07 VITALS — BP 161/102 | HR 65 | Ht 67.0 in | Wt 224.0 lb

## 2024-05-07 DIAGNOSIS — Z01419 Encounter for gynecological examination (general) (routine) without abnormal findings: Secondary | ICD-10-CM | POA: Diagnosis present

## 2024-05-07 MED ORDER — AMLODIPINE BESYLATE 5 MG PO TABS: 5.0000 mg | ORAL_TABLET | Freq: Every day | ORAL | 4 refills | Status: DC

## 2024-05-07 NOTE — Progress Notes (Signed)
 Subjective:     Brandy Farrell is a 33 y.o. female here for a routine exam.  Patient's last menstrual period was 04/10/2024 (approximate). [id number] Birth Control Method:  none wants IUD Menstrual Calendar(currently): regular  Current complaints: none.   Current acute medical issues:  none   Recent Gynecologic History Patient's last menstrual period was 04/10/2024 (approximate). Last Pap: 2022,  normal Last mammogram: ,    Past Medical History:  Diagnosis Date   GERD (gastroesophageal reflux disease)    History of narcotic addiction (HCC)    HSV infection     History reviewed. No pertinent surgical history.  OB History     Gravida  2   Para  2   Term  2   Preterm      AB      Living  3      SAB      IAB      Ectopic      Multiple  1   Live Births  3           Social History   Socioeconomic History   Marital status: Single    Spouse name: Not on file   Number of children: Not on file   Years of education: Not on file   Highest education level: Not on file  Occupational History   Not on file  Tobacco Use   Smoking status: Every Day    Current packs/day: 0.00    Average packs/day: 1 pack/day for 11.0 years (11.0 ttl pk-yrs)    Types: Cigarettes    Start date: 12/21/2010    Last attempt to quit: 12/20/2021    Years since quitting: 2.3   Smokeless tobacco: Never  Vaping Use   Vaping status: Never Used  Substance and Sexual Activity   Alcohol use: Yes    Comment: occasionally   Drug use: Not Currently    Types: Cocaine, Oxycodone    Sexual activity: Yes    Birth control/protection: None, Other-see comments    Comment: pull out  Other Topics Concern   Not on file  Social History Narrative   Not on file   Social Drivers of Health   Financial Resource Strain: Low Risk  (05/07/2024)   Overall Financial Resource Strain (CARDIA)    Difficulty of Paying Living Expenses: Not hard at all  Food Insecurity: No Food Insecurity (05/07/2024)    Hunger Vital Sign    Worried About Running Out of Food in the Last Year: Never true    Ran Out of Food in the Last Year: Never true  Transportation Needs: No Transportation Needs (05/07/2024)   PRAPARE - Administrator, Civil Service (Medical): No    Lack of Transportation (Non-Medical): No  Physical Activity: Insufficiently Active (05/07/2024)   Exercise Vital Sign    Days of Exercise per Week: 1 day    Minutes of Exercise per Session: 10 min  Stress: No Stress Concern Present (05/07/2024)   [employer] of Occupational Health - Occupational Stress Questionnaire    Feeling of Stress: Not at all  Social Connections: Moderately Integrated (05/07/2024)   Social Connection and Isolation Panel    Frequency of Communication with Friends and Family: Twice a week    Frequency of Social Gatherings with Friends and Family: Once a week    Attends Religious Services: 1 to 4 times per year    Active Member of [employer] or Organizations: No    Attends Banker Meetings:  Never    Marital Status: Living with partner    Family History  Problem Relation Age of Onset   Colon cancer Maternal Grandmother    Cancer Maternal Grandmother        colon   Hypertension Mother    Cancer Other        PGGM     Current Outpatient Medications:    amLODipine  (NORVASC ) 5 MG tablet, Take 1 tablet (5 mg total) by mouth daily., Disp: 30 tablet, Rfl: 4   buprenorphine  (SUBUTEX ) 8 MG SUBL SL tablet, Place 8 mg under the tongue 2 (two) times daily., Disp: , Rfl:    acyclovir  (ZOVIRAX ) 400 MG tablet, Take 1 tablet (400 mg total) by mouth 3 (three) times daily. (Patient not taking: Reported on 11/27/2023), Disp: 90 tablet, Rfl: 2   amLODipine  (NORVASC ) 5 MG tablet, Take 1 tablet (5 mg total) by mouth daily., Disp: 30 tablet, Rfl: 0   Blood Pressure Monitor MISC, For regular home bp monitoring during pregnancy (Patient not taking: Reported on 11/27/2023), Disp: 1 each, Rfl: 0  Review of  Systems  Review of Systems  Constitutional: Negative for fever, chills, weight loss, malaise/fatigue and diaphoresis.  HENT: Negative for hearing loss, ear pain, nosebleeds, congestion, sore throat, neck pain, tinnitus and ear discharge.   Eyes: Negative for blurred vision, double vision, photophobia, pain, discharge and redness.  Respiratory: Negative for cough, hemoptysis, sputum production, shortness of breath, wheezing and stridor.   Cardiovascular: Negative for chest pain, palpitations, orthopnea, claudication, leg swelling and PND.  Gastrointestinal: negative for abdominal pain. Negative for heartburn, nausea, vomiting, diarrhea, constipation, blood in stool and melena.  Genitourinary: Negative for dysuria, urgency, frequency, hematuria and flank pain.  Musculoskeletal: Negative for myalgias, back pain, joint pain and falls.  Skin: Negative for itching and rash.  Neurological: Negative for dizziness, tingling, tremors, sensory change, speech change, focal weakness, seizures, loss of consciousness, weakness and headaches.  Endo/Heme/Allergies: Negative for environmental allergies and polydipsia. Does not bruise/bleed easily.  Psychiatric/Behavioral: Negative for depression, suicidal ideas, hallucinations, memory loss and substance abuse. The patient is not nervous/anxious and does not have insomnia.        Objective:  Blood pressure (!) 161/89, pulse 74, height 5' 7 (1.702 m), weight 224 lb (101.6 kg), last menstrual period 04/10/2024, not currently breastfeeding.   Physical Exam  Vitals reviewed. Constitutional: She is oriented to person, place, and time. She appears well-developed and well-nourished.  HENT:  Head: Normocephalic and atraumatic.        Right Ear: External ear normal.  Left Ear: External ear normal.  Nose: Nose normal.  Mouth/Throat: Oropharynx is clear and moist.  Eyes: Conjunctivae and EOM are normal. Pupils are equal, round, and reactive to light. Right eye  exhibits no discharge. Left eye exhibits no discharge. No scleral icterus.  Neck: Normal range of motion. Neck supple. No tracheal deviation present. No thyromegaly present.  Cardiovascular: Normal rate, regular rhythm, normal heart sounds and intact distal pulses.  Exam reveals no gallop and no friction rub.   No murmur heard. Respiratory: Effort normal and breath sounds normal. No respiratory distress. She has no wheezes. She has no rales. She exhibits no tenderness.  GI: Soft. Bowel sounds are normal. She exhibits no distension and no mass. There is no tenderness. There is no rebound and no guarding.  Genitourinary:  Breasts no masses skin changes or nipple changes bilaterally      Vulva is normal without lesions Vagina is pink moist without  discharge Cervix normal in appearance and pap is done Uterus is normal size shape and contour Adnexa is negative with normal sized ovaries   Musculoskeletal: Normal range of motion. She exhibits no edema and no tenderness.  Neurological: She is alert and oriented to person, place, and time. She has normal reflexes. She displays normal reflexes. No cranial nerve deficit. She exhibits normal muscle tone. Coordination normal.  Skin: Skin is warm and dry. No rash noted. No erythema. No pallor.  Psychiatric: She has a normal mood and affect. Her behavior is normal. Judgment and thought content normal.       Medications Ordered at today's visit: Meds ordered this encounter  Medications   amLODipine  (NORVASC ) 5 MG tablet    Sig: Take 1 tablet (5 mg total) by mouth daily.    Dispense:  30 tablet    Refill:  4    Other orders placed at today's visit: No orders of the defined types were placed in this encounter.    ASSESSMENT + PLAN:    ICD-10-CM   1. Well woman exam with routine gynecological exam  Z01.419     2. Encounter for gynecological examination with Papanicolaou smear of cervix  Z01.419 Cytology - PAP( )          No  follow-ups on file.

## 2024-05-13 LAB — CYTOLOGY - PAP
Adequacy: ABSENT
Chlamydia: NEGATIVE
Comment: NEGATIVE
Comment: NEGATIVE
Comment: NEGATIVE
Comment: NORMAL
Diagnosis: NEGATIVE
High risk HPV: NEGATIVE
Neisseria Gonorrhea: NEGATIVE
Trichomonas: NEGATIVE

## 2024-06-13 ENCOUNTER — Ambulatory Visit (INDEPENDENT_AMBULATORY_CARE_PROVIDER_SITE_OTHER): Payer: MEDICAID | Admitting: *Deleted

## 2024-06-13 ENCOUNTER — Encounter: Payer: Self-pay | Admitting: *Deleted

## 2024-06-13 DIAGNOSIS — Z3201 Encounter for pregnancy test, result positive: Secondary | ICD-10-CM

## 2024-06-13 DIAGNOSIS — Z789 Other specified health status: Secondary | ICD-10-CM

## 2024-06-13 LAB — POCT URINE PREGNANCY: Preg Test, Ur: POSITIVE — AB

## 2024-06-13 NOTE — Progress Notes (Signed)
   NURSE VISIT- PREGNANCY CONFIRMATION   SUBJECTIVE:  Brandy Farrell is a 33 y.o. G16P2003 female at Unknown by uncertain LMP of No LMP recorded. Here for pregnancy confirmation.  Home pregnancy test: positive x 3  She reports no complaints.  She is not taking prenatal vitamins.    OBJECTIVE:  There were no vitals taken for this visit.  Appears well, in no apparent distress  No results found for this or any previous visit (from the past 24 hours).  ASSESSMENT: Positive pregnancy test, Unknown by LMP    PLAN: Schedule for dating ultrasound in pending hcg level  Prenatal vitamins: plans to begin OTC ASAP   Nausea medicines: not currently needed   OB packet given: Yes   Pt unsure if she plans to keep pregnancy, will decide after hcg level.  Alan LITTIE Fischer  06/13/2024 3:49 PM

## 2024-06-14 ENCOUNTER — Ambulatory Visit: Payer: Self-pay | Admitting: Adult Health

## 2024-06-14 LAB — BETA HCG QUANT (REF LAB): hCG Quant: 72282 m[IU]/mL

## 2024-08-21 ENCOUNTER — Encounter: Payer: MEDICAID | Admitting: *Deleted

## 2024-08-21 ENCOUNTER — Other Ambulatory Visit (HOSPITAL_COMMUNITY)
Admission: RE | Admit: 2024-08-21 | Discharge: 2024-08-21 | Disposition: A | Discharge status: Home / Self Care | Payer: MEDICAID | Source: Ambulatory Visit | Attending: Advanced Practice Midwife | Admitting: Advanced Practice Midwife

## 2024-08-21 ENCOUNTER — Ambulatory Visit (INDEPENDENT_AMBULATORY_CARE_PROVIDER_SITE_OTHER): Payer: MEDICAID | Admitting: Advanced Practice Midwife

## 2024-08-21 ENCOUNTER — Encounter: Payer: Self-pay | Admitting: *Deleted

## 2024-08-21 ENCOUNTER — Encounter: Payer: Self-pay | Admitting: Advanced Practice Midwife

## 2024-08-21 VITALS — BP 118/71 | HR 73 | Wt 216.0 lb

## 2024-08-21 DIAGNOSIS — Z3480 Encounter for supervision of other normal pregnancy, unspecified trimester: Secondary | ICD-10-CM | POA: Insufficient documentation

## 2024-08-21 DIAGNOSIS — Z113 Encounter for screening for infections with a predominantly sexual mode of transmission: Secondary | ICD-10-CM | POA: Diagnosis present

## 2024-08-21 DIAGNOSIS — O0930 Supervision of pregnancy with insufficient antenatal care, unspecified trimester: Secondary | ICD-10-CM | POA: Insufficient documentation

## 2024-08-21 DIAGNOSIS — O09529 Supervision of elderly multigravida, unspecified trimester: Secondary | ICD-10-CM | POA: Insufficient documentation

## 2024-08-21 DIAGNOSIS — Z8759 Personal history of other complications of pregnancy, childbirth and the puerperium: Secondary | ICD-10-CM

## 2024-08-21 DIAGNOSIS — Z3482 Encounter for supervision of other normal pregnancy, second trimester: Secondary | ICD-10-CM

## 2024-08-21 DIAGNOSIS — Z1332 Encounter for screening for maternal depression: Secondary | ICD-10-CM

## 2024-08-21 DIAGNOSIS — F1190 Opioid use, unspecified, uncomplicated: Secondary | ICD-10-CM | POA: Diagnosis not present

## 2024-08-21 DIAGNOSIS — Z131 Encounter for screening for diabetes mellitus: Secondary | ICD-10-CM

## 2024-08-21 DIAGNOSIS — R7689 Other specified abnormal immunological findings in serum: Secondary | ICD-10-CM

## 2024-08-21 DIAGNOSIS — O0990 Supervision of high risk pregnancy, unspecified, unspecified trimester: Secondary | ICD-10-CM | POA: Insufficient documentation

## 2024-08-21 DIAGNOSIS — Z3A18 18 weeks gestation of pregnancy: Secondary | ICD-10-CM

## 2024-08-21 DIAGNOSIS — O09522 Supervision of elderly multigravida, second trimester: Secondary | ICD-10-CM | POA: Diagnosis not present

## 2024-08-21 DIAGNOSIS — O99322 Drug use complicating pregnancy, second trimester: Secondary | ICD-10-CM | POA: Diagnosis not present

## 2024-08-21 DIAGNOSIS — O10912 Unspecified pre-existing hypertension complicating pregnancy, second trimester: Secondary | ICD-10-CM

## 2024-08-21 DIAGNOSIS — I10 Essential (primary) hypertension: Secondary | ICD-10-CM

## 2024-08-21 DIAGNOSIS — F1120 Opioid dependence, uncomplicated: Secondary | ICD-10-CM

## 2024-08-21 DIAGNOSIS — Z363 Encounter for antenatal screening for malformations: Secondary | ICD-10-CM

## 2024-08-21 MED ORDER — PRENATAL VITAMINS 28-0.8 MG PO TABS: 1.0000 | ORAL_TABLET | Freq: Every day | ORAL | 11 refills | Status: AC

## 2024-08-21 MED ORDER — ASPIRIN 81 MG PO CHEW: 162.0000 mg | CHEWABLE_TABLET | Freq: Every day | ORAL | 7 refills | Status: AC

## 2024-08-21 MED ORDER — BLOOD PRESSURE CUFF MISC: 1.0000 | 0 refills | Status: AC

## 2024-08-21 NOTE — Patient Instructions (Signed)
Brandy Farrell, thank you for choosing our office today! We appreciate the opportunity to meet your healthcare needs. You may receive a short survey by mail, e-mail, or through MyChart. If you are happy with your care we would appreciate if you could take just a few minutes to complete the survey questions. We read all of your comments and take your feedback very seriously. Thank you again for choosing our office.  [facility] Team at [facility] at  ([address] [place], [place] [address]) Entrance C, located off of [address] Free 24/7 valet parking   Nausea & Vomiting Have saltine crackers or pretzels by your bed and eat a few bites before you raise your head out of bed in the morning Eat small frequent meals throughout the day instead of large meals Drink plenty of fluids throughout the day to stay hydrated, just don't drink a lot of fluids with your meals.  This can make your stomach fill up faster making you feel sick Do not brush your teeth right after you eat Products with real ginger are good for nausea, like ginger ale and ginger hard candy Make sure it says made with real ginger! Sucking on sour candy like lemon heads is also good for nausea If your prenatal vitamins make you nauseated, take them at night so you will sleep through the nausea Sea Bands If you feel like you need medicine for the nausea & vomiting please let us know If you are unable to keep any fluids or food down please let us know   Constipation Drink plenty of fluid, preferably water, throughout the day Eat foods high in fiber such as fruits, vegetables, and grains Exercise, such as walking, is a good way to keep your bowels regular Drink warm fluids, especially warm prune juice, or decaf coffee Eat a 1/2 cup of real oatmeal (not instant), 1/2 cup applesauce, and 1/2-1 cup warm prune juice every day If needed, you may take Colace (docusate sodium) stool softener  once or twice a day to help keep the stool soft.  If you still are having problems with constipation, you may take Miralax once daily as needed to help keep your bowels regular.   Home Blood Pressure Monitoring for Patients   Your provider has recommended that you check your blood pressure (BP) at least once a week at home. If you do not have a blood pressure cuff at home, one will be provided for you. Contact your provider if you have not received your monitor within 1 week.   Helpful Tips for Accurate Home Blood Pressure Checks  Don't smoke, exercise, or drink caffeine 30 minutes before checking your BP Use the restroom before checking your BP (a full bladder can raise your pressure) Relax in a comfortable upright chair Feet on the ground Left arm resting comfortably on a flat surface at the level of your heart Legs uncrossed Back supported Sit quietly and don't talk Place the cuff on your bare arm Adjust snuggly, so that only two fingertips can fit between your skin and the top of the cuff Check 2 readings separated by at least one minute Keep a log of your BP readings For a visual, please reference this diagram: http://ccnc.care/bpdiagram  Provider Name: [facility]     Phone: [phone]  Zone 1: ALL CLEAR  Continue to monitor your symptoms:  BP reading is less than 140 (top number) or less than 90 (bottom   number)  No right upper stomach pain No headaches or seeing spots No feeling nauseated or throwing up No swelling in face and hands  Zone 2: CAUTION Call your doctor's office for any of the following:  BP reading is greater than 140 (top number) or greater than 90 (bottom number)  Stomach pain under your ribs in the middle or right side Headaches or seeing spots Feeling nauseated or throwing up Swelling in face and hands  Zone 3: EMERGENCY  Seek immediate medical care if you have any of the following:  BP reading is greater than160 (top number) or greater than  110 (bottom number) Severe headaches not improving with Tylenol Serious difficulty catching your breath Any worsening symptoms from Zone 2    First Trimester of Pregnancy The first trimester of pregnancy is from week 1 until the end of week 12 (months 1 through 3). A week after a sperm fertilizes an egg, the egg will implant on the wall of the uterus. This embryo will begin to develop into a baby. Genes from you and your partner are forming the baby. The female genes determine whether the baby is a boy or a girl. At 6-8 weeks, the eyes and face are formed, and the heartbeat can be seen on ultrasound. At the end of 12 weeks, all the baby's organs are formed.  Now that you are pregnant, you will want to do everything you can to have a healthy baby. Two of the most important things are to get good prenatal care and to follow your health care provider's instructions. Prenatal care is all the medical care you receive before the baby's birth. This care will help prevent, find, and treat any problems during the pregnancy and childbirth. BODY CHANGES Your body goes through many changes during pregnancy. The changes vary from woman to woman.  You may gain or lose a couple of pounds at first. You may feel sick to your stomach (nauseous) and throw up (vomit). If the vomiting is uncontrollable, call your health care provider. You may tire easily. You may develop headaches that can be relieved by medicines approved by your health care provider. You may urinate more often. Painful urination may mean you have a bladder infection. You may develop heartburn as a result of your pregnancy. You may develop constipation because certain hormones are causing the muscles that push waste through your intestines to slow down. You may develop hemorrhoids or swollen, bulging veins (varicose veins). Your breasts may begin to grow larger and become tender. Your nipples may stick out more, and the tissue that surrounds them  (areola) may become darker. Your gums may bleed and may be sensitive to brushing and flossing. Dark spots or blotches (chloasma, mask of pregnancy) may develop on your face. This will likely fade after the baby is born. Your menstrual periods will stop. You may have a loss of appetite. You may develop cravings for certain kinds of food. You may have changes in your emotions from day to day, such as being excited to be pregnant or being concerned that something may go wrong with the pregnancy and baby. You may have more vivid and strange dreams. You may have changes in your hair. These can include thickening of your hair, rapid growth, and changes in texture. Some women also have hair loss during or after pregnancy, or hair that feels dry or thin. Your hair will most likely return to normal after your baby is born. WHAT TO EXPECT AT YOUR PRENATAL   VISITS During a routine prenatal visit: You will be weighed to make sure you and the baby are growing normally. Your blood pressure will be taken. Your abdomen will be measured to track your baby's growth. The fetal heartbeat will be listened to starting around week 10 or 12 of your pregnancy. Test results from any previous visits will be discussed. Your health care provider may ask you: How you are feeling. If you are feeling the baby move. If you have had any abnormal symptoms, such as leaking fluid, bleeding, severe headaches, or abdominal cramping. If you have any questions. Other tests that may be performed during your first trimester include: Blood tests to find your blood type and to check for the presence of any previous infections. They will also be used to check for low iron levels (anemia) and Rh antibodies. Later in the pregnancy, blood tests for diabetes will be done along with other tests if problems develop. Urine tests to check for infections, diabetes, or protein in the urine. An ultrasound to confirm the proper growth and development  of the baby. An amniocentesis to check for possible genetic problems. Fetal screens for spina bifida and Down syndrome. You may need other tests to make sure you and the baby are doing well. HOME CARE INSTRUCTIONS  Medicines Follow your health care provider's instructions regarding medicine use. Specific medicines may be either safe or unsafe to take during pregnancy. Take your prenatal vitamins as directed. If you develop constipation, try taking a stool softener if your health care provider approves. Diet Eat regular, well-balanced meals. Choose a variety of foods, such as meat or vegetable-based protein, fish, milk and low-fat dairy products, vegetables, fruits, and whole grain breads and cereals. Your health care provider will help you determine the amount of weight gain that is right for you. Avoid raw meat and uncooked cheese. These carry germs that can cause birth defects in the baby. Eating four or five small meals rather than three large meals a day may help relieve nausea and vomiting. If you start to feel nauseous, eating a few soda crackers can be helpful. Drinking liquids between meals instead of during meals also seems to help nausea and vomiting. If you develop constipation, eat more high-fiber foods, such as fresh vegetables or fruit and whole grains. Drink enough fluids to keep your urine clear or pale yellow. Activity and Exercise Exercise only as directed by your health care provider. Exercising will help you: Control your weight. Stay in shape. Be prepared for labor and delivery. Experiencing pain or cramping in the lower abdomen or low back is a good sign that you should stop exercising. Check with your health care provider before continuing normal exercises. Try to avoid standing for long periods of time. Move your legs often if you must stand in one place for a long time. Avoid heavy lifting. Wear low-heeled shoes, and practice good posture. You may continue to have sex  unless your health care provider directs you otherwise. Relief of Pain or Discomfort Wear a good support bra for breast tenderness.   Take warm sitz baths to soothe any pain or discomfort caused by hemorrhoids. Use hemorrhoid cream if your health care provider approves.   Rest with your legs elevated if you have leg cramps or low back pain. If you develop varicose veins in your legs, wear support hose. Elevate your feet for 15 minutes, 3-4 times a day. Limit salt in your diet. Prenatal Care Schedule your prenatal visits by the   twelfth week of pregnancy. They are usually scheduled monthly at first, then more often in the last 2 months before delivery. Write down your questions. Take them to your prenatal visits. Keep all your prenatal visits as directed by your health care provider. Safety Wear your seat belt at all times when driving. Make a list of emergency phone numbers, including numbers for family, friends, the hospital, and police and fire departments. General Tips Ask your health care provider for a referral to a local prenatal education class. Begin classes no later than at the beginning of month 6 of your pregnancy. Ask for help if you have counseling or nutritional needs during pregnancy. Your health care provider can offer advice or refer you to specialists for help with various needs. Do not use hot tubs, steam rooms, or saunas. Do not douche or use tampons or scented sanitary pads. Do not cross your legs for long periods of time. Avoid cat litter boxes and soil used by cats. These carry germs that can cause birth defects in the baby and possibly loss of the fetus by miscarriage or stillbirth. Avoid all smoking, herbs, alcohol, and medicines not prescribed by your health care provider. Chemicals in these affect the formation and growth of the baby. Schedule a dentist appointment. At home, brush your teeth with a soft toothbrush and be gentle when you floss. SEEK MEDICAL CARE IF:   You have dizziness. You have mild pelvic cramps, pelvic pressure, or nagging pain in the abdominal area. You have persistent nausea, vomiting, or diarrhea. You have a bad smelling vaginal discharge. You have pain with urination. You notice increased swelling in your face, hands, legs, or ankles. SEEK IMMEDIATE MEDICAL CARE IF:  You have a fever. You are leaking fluid from your vagina. You have spotting or bleeding from your vagina. You have severe abdominal cramping or pain. You have rapid weight gain or loss. You vomit blood or material that looks like coffee grounds. You are exposed to German measles and have never had them. You are exposed to fifth disease or chickenpox. You develop a severe headache. You have shortness of breath. You have any kind of trauma, such as from a fall or a car accident. Document Released: 09/27/2001 Document Revised: 02/17/2014 Document Reviewed: 08/13/2013 [employer] Patient Information 2015 [employer]. This information is not intended to replace advice given to you by your health care provider. Make sure you discuss any questions you have with your health care provider.  

## 2024-08-21 NOTE — Progress Notes (Signed)
 INITIAL OBSTETRICAL VISIT Patient name: Brandy Farrell MRN [id number]  Date of birth: 1990/12/21 Chief Complaint:   Initial Prenatal Visit  History of Present Illness:   Brandy Farrell is a 33 y.o. G22P2003 mixed race female at [redacted]w[redacted]d by US  at 13.5 weeks with an Estimated Date of Delivery: 01/18/25 being seen today for her initial obstetrical visit.   No LMP recorded (lmp unknown). Patient is pregnant. Her obstetrical history is significant for vag deliveries x 3 (2008 and twins 2024).   Today she reports initially feeling ambivalent about the pregnancy, but now doing better.  Last pap July 2025. Results were: NILM w/ HRHPV negative     08/21/2024   11:05 AM 05/07/2024    3:53 PM 06/03/2022   10:20 AM 06/03/2022   10:08 AM 09/14/2021    9:14 AM  Depression screen PHQ 2/9  Decreased Interest 0 0 1 1 0  Down, Depressed, Hopeless 0 0 0 0 0  PHQ - 2 Score 0 0 1 1 0  Altered sleeping 0 0 2 2 0  Tired, decreased energy 0 0 2 2 0  Change in appetite 0 0 0 0 0  Feeling bad or failure about yourself  0 0 0 0 0  Trouble concentrating 0 0 0 0 0  Moving slowly or fidgety/restless 0 0 0 0 0  Suicidal thoughts 0 0 0 0 0  PHQ-9 Score 0 0 5 5 0        08/21/2024   11:05 AM 05/07/2024    3:53 PM 06/03/2022   10:24 AM 06/03/2022   10:08 AM  GAD 7 : Generalized Anxiety Score  Nervous, Anxious, on Edge 0 0 0 0  Control/stop worrying 0 0 0 0  Worry too much - different things 0 0 0 0  Trouble relaxing 0 0 0 0  Restless 0 0 0 0  Easily annoyed or irritable 0 0 0 0  Afraid - awful might happen 0 0 0 0  Total GAD 7 Score 0 0 0 0     Review of Systems:   Pertinent items are noted in HPI Denies cramping/contractions, leakage of fluid, vaginal bleeding, abnormal vaginal discharge w/ itching/odor/irritation, headaches, visual changes, shortness of breath, chest pain, abdominal pain, severe nausea/vomiting, or problems with urination or bowel movements unless otherwise stated above.   Pertinent History Reviewed:  Reviewed past medical,surgical, social, obstetrical and family history.  Reviewed problem list, medications and allergies. OB History  Gravida Para Term Preterm AB Living  3 2 2   3   SAB IAB Ectopic Multiple Live Births     1 3    # Outcome Date GA Lbr Len/2nd Weight Sex Type Anes PTL Lv  3 Current           2A Term 11/27/22 [redacted]w[redacted]d 06:32 / 05:29 6 lb 3.8 oz (2.83 kg) M Vag-Spont EPI  LIV  2B Term 11/27/22 [redacted]w[redacted]d 06:32 / 05:39 5 lb 12.4 oz (2.62 kg) M Vag-Spont EPI  LIV  1 Term 04/06/07 [redacted]w[redacted]d  6 lb 14 oz (3.118 kg) M Vag-Spont EPI N LIV   Physical Assessment:   Vitals:   08/21/24 1110  BP: 118/71  Pulse: 73  Weight: 216 lb (98 kg)  Body mass index is 33.83 kg/m.       Physical Examination:  General appearance - well appearing, and in no distress  Mental status - alert, oriented to person, place, and time  Psych:  She has a  normal mood and affect  Skin - warm and dry, normal color, no suspicious lesions noted  Chest - effort normal, all lung fields clear to auscultation bilaterally  Heart - normal rate and regular rhythm  Abdomen - soft, nontender  Extremities:  No swelling or varicosities noted  Pelvic - exam not indicated  Thin prep pap is not done    No results found for this or any previous visit (from the past 24 hours).  Assessment & Plan:  1) High-Risk Pregnancy G3P2003 at [redacted]w[redacted]d with an Estimated Date of Delivery: 01/18/25   2) Initial OB visit  3) cHTN> no meds currently; rx bASA 162mg /day & baseline labs; q 4wk EFW after 24wks if remains off meds, with IOL 38-40wk  4) OUD> stable on Subutex  8mg /day rx by [facility]  5) AMA> age 80 at delivery, no change in care  6) Hx PPH w twin delivery> totaling 1L with 1uPRBC transfusion; plan for TXA at delivery  Meds:  Meds ordered this encounter  Medications   Blood Pressure Monitoring (BLOOD PRESSURE CUFF) MISC    Sig: 1 kit by Does not apply route as directed.    Dispense:   1 each    Refill:  0   aspirin  81 MG chewable tablet    Sig: Chew 2 tablets (162 mg total) by mouth daily.    Dispense:  60 tablet    Refill:  7    Supervising Provider:   MARILYNN Farrell [[id number]]   Prenatal Vit-Fe Fumarate-FA (PRENATAL VITAMINS) 28-0.8 MG TABS    Sig: Take 1 tablet by mouth daily.    Dispense:  30 tablet    Refill:  11    Supervising Provider:   MARILYNN Farrell [[id number]]    Initial labs obtained Continue prenatal vitamins Reviewed n/v relief measures and warning s/s to report Reviewed recommended weight gain based on pre-gravid BMI Encouraged well-balanced diet Genetic & carrier screening discussed: requests Brandy Farrell, declines Horizon (neg Aug 2023) Ultrasound discussed; fetal survey: requested CCNC completed> form faxed if has or is planning to apply for medicaid The nature of [place] [facility] [employer] with multiple MDs and other Advanced Practice Providers was explained to patient; also emphasized that fellows, residents, and students are part of our team. Does not have home bp cuff. Office bp cuff given: no. Rx sent: yes. Check bp weekly, let us  know if consistently >140/90.   Indications for ASA therapy (per uptodate) One of the following: CHTN Yes   Follow-up: Return for first avail anatomy u/s; 4wk HROB.   Orders Placed This Encounter  Procedures   Urine Culture   US  OB Comp + 14 Wk   CBC/D/Plt+RPR+Rh+ABO+RubIgG...   HgB A1c   Protein / creatinine ratio, urine   PANORAMA PRENATAL TEST   Comp Met (CMET)    Suzen JONETTA Farrell [facility] 08/21/2024 12:26 PM

## 2024-08-22 LAB — CBC/D/PLT+RPR+RH+ABO+RUBIGG...
Antibody Screen: NEGATIVE
Basophils Absolute: 0 x10E3/uL (ref 0.0–0.2)
Basos: 0 %
EOS (ABSOLUTE): 0.4 x10E3/uL (ref 0.0–0.4)
Eos: 4 %
HCV Ab: NONREACTIVE
HIV Screen 4th Generation wRfx: NONREACTIVE
Hematocrit: 30.7 % — ABNORMAL LOW (ref 34.0–46.6)
Hemoglobin: 10.2 g/dL — ABNORMAL LOW (ref 11.1–15.9)
Hepatitis B Surface Ag: NEGATIVE
Immature Grans (Abs): 0 x10E3/uL (ref 0.0–0.1)
Immature Granulocytes: 0 %
Lymphocytes Absolute: 2.4 x10E3/uL (ref 0.7–3.1)
Lymphs: 25 %
MCH: 31.4 pg (ref 26.6–33.0)
MCHC: 33.2 g/dL (ref 31.5–35.7)
MCV: 95 fL (ref 79–97)
Monocytes Absolute: 0.7 x10E3/uL (ref 0.1–0.9)
Monocytes: 7 %
Neutrophils Absolute: 5.9 x10E3/uL (ref 1.4–7.0)
Neutrophils: 64 %
Platelets: 296 x10E3/uL (ref 150–450)
RBC: 3.25 x10E6/uL — ABNORMAL LOW (ref 3.77–5.28)
RDW: 11.3 % — ABNORMAL LOW (ref 11.7–15.4)
RPR Ser Ql: NONREACTIVE
Rh Factor: POSITIVE
Rubella Antibodies, IGG: 6.42 {index} (ref >0.99)
WBC: 9.4 x10E3/uL (ref 3.4–10.8)

## 2024-08-22 LAB — CERVICOVAGINAL ANCILLARY ONLY
Chlamydia: NEGATIVE
Comment: NEGATIVE
Comment: NORMAL
Neisseria Gonorrhea: NEGATIVE

## 2024-08-22 LAB — HEMOGLOBIN A1C
Est. average glucose Bld gHb Est-mCnc: 88 mg/dL
Hgb A1c MFr Bld: 4.7 % — ABNORMAL LOW (ref 4.8–5.6)

## 2024-08-22 LAB — HCV INTERPRETATION

## 2024-08-22 LAB — PROTEIN / CREATININE RATIO, URINE
Creatinine, Urine: 204.1 mg/dL
Protein, Ur: 21.7 mg/dL
Protein/Creat Ratio: 106 mg/g{creat} (ref 0–200)

## 2024-08-22 LAB — SPECIMEN STATUS REPORT

## 2024-08-23 LAB — URINE CULTURE

## 2024-08-25 ENCOUNTER — Other Ambulatory Visit: Payer: Self-pay | Admitting: Advanced Practice Midwife

## 2024-08-25 ENCOUNTER — Ambulatory Visit: Payer: Self-pay | Admitting: Advanced Practice Midwife

## 2024-08-25 DIAGNOSIS — O99012 Anemia complicating pregnancy, second trimester: Secondary | ICD-10-CM

## 2024-08-25 MED ORDER — ACCRUFER 30 MG PO CAPS: 1.0000 | ORAL_CAPSULE | ORAL | 3 refills | Status: AC

## 2024-08-26 LAB — COMPREHENSIVE METABOLIC PANEL WITH GFR
ALT: 7 IU/L (ref 0–32)
AST: 10 IU/L (ref 0–40)
Albumin: 3.7 g/dL — ABNORMAL LOW (ref 3.9–4.9)
Alkaline Phosphatase: 85 IU/L (ref 41–116)
BUN/Creatinine Ratio: 10 (ref 9–23)
BUN: 6 mg/dL (ref 6–20)
Bilirubin Total: 0.2 mg/dL (ref 0.0–1.2)
CO2: 19 mmol/L — ABNORMAL LOW (ref 20–29)
Calcium: 8.8 mg/dL (ref 8.7–10.2)
Chloride: 102 mmol/L (ref 96–106)
Creatinine, Ser: 0.61 mg/dL (ref 0.57–1.00)
Globulin, Total: 2.8 g/dL (ref 1.5–4.5)
Glucose: 85 mg/dL (ref 70–99)
Potassium: 4.2 mmol/L (ref 3.5–5.2)
Sodium: 137 mmol/L (ref 134–144)
Total Protein: 6.5 g/dL (ref 6.0–8.5)
eGFR: 119 mL/min/1.73 (ref >59)

## 2024-08-26 LAB — SPECIMEN STATUS REPORT

## 2024-08-28 LAB — PANORAMA PRENATAL TEST FULL PANEL:PANORAMA TEST PLUS 5 ADDITIONAL MICRODELETIONS: FETAL FRACTION: 20.7

## 2024-09-03 ENCOUNTER — Other Ambulatory Visit: Payer: MEDICAID

## 2024-09-04 ENCOUNTER — Other Ambulatory Visit: Payer: Self-pay | Admitting: Advanced Practice Midwife

## 2024-09-04 DIAGNOSIS — F1120 Opioid dependence, uncomplicated: Secondary | ICD-10-CM

## 2024-09-04 DIAGNOSIS — R7689 Other specified abnormal immunological findings in serum: Secondary | ICD-10-CM

## 2024-09-04 DIAGNOSIS — Z3A18 18 weeks gestation of pregnancy: Secondary | ICD-10-CM

## 2024-09-04 DIAGNOSIS — Z3482 Encounter for supervision of other normal pregnancy, second trimester: Secondary | ICD-10-CM

## 2024-09-04 DIAGNOSIS — E669 Obesity, unspecified: Secondary | ICD-10-CM

## 2024-09-04 DIAGNOSIS — O09529 Supervision of elderly multigravida, unspecified trimester: Secondary | ICD-10-CM

## 2024-09-04 DIAGNOSIS — O0930 Supervision of pregnancy with insufficient antenatal care, unspecified trimester: Secondary | ICD-10-CM

## 2024-09-04 DIAGNOSIS — O09522 Supervision of elderly multigravida, second trimester: Secondary | ICD-10-CM

## 2024-09-04 DIAGNOSIS — I10 Essential (primary) hypertension: Secondary | ICD-10-CM

## 2024-09-04 DIAGNOSIS — Z131 Encounter for screening for diabetes mellitus: Secondary | ICD-10-CM

## 2024-09-04 DIAGNOSIS — Z363 Encounter for antenatal screening for malformations: Secondary | ICD-10-CM

## 2024-09-04 DIAGNOSIS — Z113 Encounter for screening for infections with a predominantly sexual mode of transmission: Secondary | ICD-10-CM

## 2024-09-04 DIAGNOSIS — Z8759 Personal history of other complications of pregnancy, childbirth and the puerperium: Secondary | ICD-10-CM

## 2024-09-04 DIAGNOSIS — Z3480 Encounter for supervision of other normal pregnancy, unspecified trimester: Secondary | ICD-10-CM

## 2024-09-04 DIAGNOSIS — O0990 Supervision of high risk pregnancy, unspecified, unspecified trimester: Secondary | ICD-10-CM

## 2024-09-09 ENCOUNTER — Ambulatory Visit: Payer: MEDICAID | Admitting: Radiology

## 2024-09-09 DIAGNOSIS — Z3A21 21 weeks gestation of pregnancy: Secondary | ICD-10-CM | POA: Diagnosis not present

## 2024-09-09 DIAGNOSIS — O09529 Supervision of elderly multigravida, unspecified trimester: Secondary | ICD-10-CM

## 2024-09-09 DIAGNOSIS — O10012 Pre-existing essential hypertension complicating pregnancy, second trimester: Secondary | ICD-10-CM

## 2024-09-09 DIAGNOSIS — E669 Obesity, unspecified: Secondary | ICD-10-CM

## 2024-09-09 DIAGNOSIS — O09522 Supervision of elderly multigravida, second trimester: Secondary | ICD-10-CM | POA: Diagnosis not present

## 2024-09-09 DIAGNOSIS — O0932 Supervision of pregnancy with insufficient antenatal care, second trimester: Secondary | ICD-10-CM | POA: Diagnosis not present

## 2024-09-09 DIAGNOSIS — F1120 Opioid dependence, uncomplicated: Secondary | ICD-10-CM

## 2024-09-09 DIAGNOSIS — I10 Essential (primary) hypertension: Secondary | ICD-10-CM

## 2024-09-09 DIAGNOSIS — Z363 Encounter for antenatal screening for malformations: Secondary | ICD-10-CM

## 2024-09-09 DIAGNOSIS — O0930 Supervision of pregnancy with insufficient antenatal care, unspecified trimester: Secondary | ICD-10-CM

## 2024-09-09 NOTE — Progress Notes (Signed)
 US : GA = 21+2 weeks  Single active female fetus, cephalic, FHR = 137 bpm, main placental lobe seen anteriorly with central cord insertion, succenturiate placental lobe seen posteriorly. MVP amn fluid = 4.7 cm, EFW 493g, 89% AC 88%, ov's not seen,  CL = 3.5 cm Anatomy screen completed, no apparent fetal abnormalities seen

## 2024-09-18 ENCOUNTER — Ambulatory Visit: Payer: MEDICAID | Admitting: Women's Health

## 2024-09-18 ENCOUNTER — Encounter: Payer: Self-pay | Admitting: Women's Health

## 2024-09-18 VITALS — BP 123/78 | HR 82 | Wt 218.0 lb

## 2024-09-18 DIAGNOSIS — Z3A22 22 weeks gestation of pregnancy: Secondary | ICD-10-CM

## 2024-09-18 DIAGNOSIS — O0992 Supervision of high risk pregnancy, unspecified, second trimester: Secondary | ICD-10-CM

## 2024-09-18 DIAGNOSIS — O0990 Supervision of high risk pregnancy, unspecified, unspecified trimester: Secondary | ICD-10-CM

## 2024-09-18 DIAGNOSIS — O10919 Unspecified pre-existing hypertension complicating pregnancy, unspecified trimester: Secondary | ICD-10-CM

## 2024-09-18 DIAGNOSIS — F1120 Opioid dependence, uncomplicated: Secondary | ICD-10-CM

## 2024-09-18 NOTE — Progress Notes (Signed)
 HIGH-RISK PREGNANCY VISIT Patient name: Brandy Farrell MRN [id number]  Date of birth: [birth year] Chief Complaint:   Routine Prenatal Visit  History of Present Illness:   MELLANIE BEJARANO is a 33 y.o. G7P2003 female at [redacted]w[redacted]d with an Estimated Date of Delivery: 01/18/25 being seen today for ongoing management of a high-risk pregnancy complicated by chronic hypertension currently on no meds and subutex  maintenance.    Today she reports no complaints. Contractions: Not present.  .  Movement: Present. denies leaking of fluid.      08/21/2024   11:05 AM 05/07/2024    3:53 PM 06/03/2022   10:20 AM 06/03/2022   10:08 AM 09/14/2021    9:14 AM  Depression screen PHQ 2/9  Decreased Interest 0 0 1 1 0  Down, Depressed, Hopeless 0 0 0 0 0  PHQ - 2 Score 0 0 1 1 0  Altered sleeping 0 0 2 2 0  Tired, decreased energy 0 0 2 2 0  Change in appetite 0 0 0 0 0  Feeling bad or failure about yourself  0 0 0 0 0  Trouble concentrating 0 0 0 0 0  Moving slowly or fidgety/restless 0 0 0 0 0  Suicidal thoughts 0 0 0 0 0  PHQ-9 Score 0  0  5  5  0      Data saved with a previous flowsheet row definition        08/21/2024   11:05 AM 05/07/2024    3:53 PM 06/03/2022   10:24 AM 06/03/2022   10:08 AM  GAD 7 : Generalized Anxiety Score  Nervous, Anxious, on Edge 0 0 0 0  Control/stop worrying 0 0 0 0  Worry too much - different things 0 0 0 0  Trouble relaxing 0 0 0 0  Restless 0 0 0 0  Easily annoyed or irritable 0 0 0 0  Afraid - awful might happen 0 0 0 0  Total GAD 7 Score 0 0 0 0     Review of Systems:   Pertinent items are noted in HPI Denies abnormal vaginal discharge w/ itching/odor/irritation, headaches, visual changes, shortness of breath, chest pain, abdominal pain, severe nausea/vomiting, or problems with urination or bowel movements unless otherwise stated above. Pertinent History Reviewed:  Reviewed past medical,surgical, social, obstetrical and family history.  Reviewed problem  list, medications and allergies. Physical Assessment:   Vitals:   09/18/24 1613  BP: 123/78  Pulse: 82  Weight: 218 lb (98.9 kg)  Body mass index is 34.14 kg/m.           Physical Examination:   General appearance: alert, well appearing, and in no distress  Mental status: alert, oriented to person, place, and time  Skin: warm & dry   Extremities: Edema: None    Cardiovascular: normal heart rate noted  Respiratory: normal respiratory effort, no distress  Abdomen: gravid, soft, non-tender  Pelvic: Cervical exam deferred         Fetal Status: Fetal Heart Rate (bpm): 144 Fundal Height: 23 cm Movement: Present    Fetal Surveillance Testing today: doppler   Chaperone: N/A  No results found for this or any previous visit (from the past 24 hours).  Assessment & Plan:  High-risk pregnancy: G3P2003 at [redacted]w[redacted]d with an Estimated Date of Delivery: 01/18/25   1) CHTN, no meds, ASA  2) Subutex  maintenance, 8mg  bid from [employer], did NAS consult last pregnancy, doesn't feel she needs to repeat  Meds: No orders of the defined types were placed in this encounter.   Labs/procedures today: none  Treatment Plan:  EFW q 4w @ 24w     no antenatal testing if bp remains <140/90 and no meds     Deliver @ 38-39.6wks:______   Reviewed: Preterm labor symptoms and general obstetric precautions including but not limited to vaginal bleeding, contractions, leaking of fluid and fetal movement were reviewed in detail with the patient.  All questions were answered. Does have home bp cuff. Office bp cuff given: not applicable. Check bp weekly, let us  know if consistently >140 and/or >90.  Follow-up: Return in about 4 weeks (around 10/16/2024) for HROB, US :EFW, PN2, MD or CNM, in person.   Future Appointments  Date Time Provider Department Center  10/16/2024  8:30 AM [facility] LAB [facility]  10/16/2024  9:15 AM [facility] IMG 2 [facility] None  10/16/2024 10:10 AM Kizzie Suzen SAUNDERS, CNM  [facility]    Orders Placed This Encounter  Procedures   US  OB Follow Up   Suzen SAUNDERS Kizzie CNM, [facility] 09/18/2024 4:40 PM

## 2024-09-18 NOTE — Patient Instructions (Signed)
 Tahesha, thank you for choosing our office today! We appreciate the opportunity to meet your healthcare needs. You may receive a short survey by mail, e-mail, or through [employer]. If you are happy with your care we would appreciate if you could take just a few minutes to complete the survey questions. We read all of your comments and take your feedback very seriously. Thank you again for choosing our office.  [facility] for [employer] Team at [facility]  [facility] at [facility] ([address] [place], [place] [address]) Entrance C, located off of [address] Free 24/7 valet parking   You will have your sugar test next visit.  Please do not eat or drink anything after midnight the night before you come, not even water.  You will be here for at least two hours.  Please make an appointment online for the bloodwork at [email or web address] for 8:00am (or as close to this as possible). Make sure you select the [facility] service center.   CLASSES: Go to [email or web address] to register for classes (childbirth, breastfeeding, waterbirth, infant CPR, daddy bootcamp, etc.)  Call the office [phone]) or go to [facility] if: You begin to have strong, frequent contractions Your water breaks.  Sometimes it is a big gush of fluid, sometimes it is just a trickle that keeps getting your panties wet or running down your legs You have vaginal bleeding.  It is normal to have a small amount of spotting if your cervix was checked.  You don't feel your baby moving like normal.  If you don't, get you something to eat and drink and lay down and focus on feeling your baby move.   If your baby is still not moving like normal, you should call the office or go to [facility].  Call the office [phone]) or go to [facility] hospital for these signs of pre-eclampsia: Severe headache that does not go away with Tylenol  Visual changes- seeing spots, double, blurred vision Pain under your right breast or upper  abdomen that does not go away with Tums or heartburn medicine Nausea and/or vomiting Severe swelling in your hands, feet, and face    [place] Pediatricians/Family Doctors [facility]): [address] Luba BROCKS, [phone]           [facility]: [address], [phone]                [facility]): [address], [phone]  [facility] Department: [address], [place], [phone]    [facility] Pediatricians/Family Doctors [facility]): [address], [phone] [facility]: [address] [place], [phone] [facility] [place]: [address], [phone]  [facility] Doctors  [facility]): [phone] [facility]: [address], [phone]   [facility] Doctors [facility]: [address], [phone]  [facility] Doctors  [facility]: [phone], [phone]  Home Blood Pressure Monitoring for Patients   Your provider has recommended that you check your blood pressure (BP) at least once a week at home. If you do not have a blood pressure cuff at home, one will be provided for you. Contact your provider if you have not received your monitor within 1 week.   Helpful Tips for Accurate Home Blood Pressure Checks  Don't smoke, exercise, or drink caffeine 30 minutes before checking  your BP Use the restroom before checking your BP (a full bladder can raise your pressure) Relax in a comfortable upright chair Feet on the ground Left arm resting comfortably on a flat surface at the level of your heart Legs uncrossed Back supported Sit quietly and don't talk Place the cuff on your bare arm Adjust snuggly, so that only two fingertips can fit between your skin and the top of the cuff Check 2 readings separated by at least one  minute Keep a log of your BP readings For a visual, please reference this diagram: http://ccnc.care/bpdiagram  Provider Name: [facility]     Phone: [phone]  Zone 1: ALL CLEAR  Continue to monitor your symptoms:  BP reading is less than 140 (top number) or less than 90 (bottom number)  No right upper stomach pain No headaches or seeing spots No feeling nauseated or throwing up No swelling in face and hands  Zone 2: CAUTION Call your doctor's office for any of the following:  BP reading is greater than 140 (top number) or greater than 90 (bottom number)  Stomach pain under your ribs in the middle or right side Headaches or seeing spots Feeling nauseated or throwing up Swelling in face and hands  Zone 3: EMERGENCY  Seek immediate medical care if you have any of the following:  BP reading is greater than160 (top number) or greater than 110 (bottom number) Severe headaches not improving with Tylenol  Serious difficulty catching your breath Any worsening symptoms from Zone 2   Second Trimester of Pregnancy The second trimester is from week 13 through week 28, months 4 through 6. The second trimester is often a time when you feel your best. Your body has also adjusted to being pregnant, and you begin to feel better physically. Usually, morning sickness has lessened or quit completely, you may have more energy, and you may have an increase in appetite. The second trimester is also a time when the fetus is growing rapidly. At the end of the sixth month, the fetus is about 9 inches long and weighs about 1 pounds. You will likely begin to feel the baby move (quickening) between 18 and 20 weeks of the pregnancy. BODY CHANGES Your body goes through many changes during pregnancy. The changes vary from woman to woman.  Your weight will continue to increase. You will notice your lower abdomen bulging out. You may begin to get stretch marks on your hips, abdomen, and breasts. You may  develop headaches that can be relieved by medicines approved by your health care provider. You may urinate more often because the fetus is pressing on your bladder. You may develop or continue to have heartburn as a result of your pregnancy. You may develop constipation because certain hormones are causing the muscles that push waste through your intestines to slow down. You may develop hemorrhoids or swollen, bulging veins (varicose veins). You may have back pain because of the weight gain and pregnancy hormones relaxing your joints between the bones in your pelvis and as a result of a shift in weight and the muscles that support your balance. Your breasts will continue to grow and be tender. Your gums may bleed and may be sensitive to brushing and flossing. Dark spots or blotches (chloasma, mask of pregnancy) may develop on your face. This will likely fade after the baby is born. A dark line from your belly button to the pubic area (linea nigra) may appear. This will likely fade after the  baby is born. You may have changes in your hair. These can include thickening of your hair, rapid growth, and changes in texture. Some women also have hair loss during or after pregnancy, or hair that feels dry or thin. Your hair will most likely return to normal after your baby is born. WHAT TO EXPECT AT YOUR PRENATAL VISITS During a routine prenatal visit: You will be weighed to make sure you and the fetus are growing normally. Your blood pressure will be taken. Your abdomen will be measured to track your baby's growth. The fetal heartbeat will be listened to. Any test results from the previous visit will be discussed. Your health care provider may ask you: How you are feeling. If you are feeling the baby move. If you have had any abnormal symptoms, such as leaking fluid, bleeding, severe headaches, or abdominal cramping. If you have any questions. Other tests that may be performed during your second  trimester include: Blood tests that check for: Low iron  levels (anemia). Gestational diabetes (between 24 and 28 weeks). Rh antibodies. Urine tests to check for infections, diabetes, or protein in the urine. An ultrasound to confirm the proper growth and development of the baby. An amniocentesis to check for possible genetic problems. Fetal screens for spina bifida and Down syndrome. HOME CARE INSTRUCTIONS  Avoid all smoking, herbs, alcohol, and unprescribed drugs. These chemicals affect the formation and growth of the baby. Follow your health care provider's instructions regarding medicine use. There are medicines that are either safe or unsafe to take during pregnancy. Exercise only as directed by your health care provider. Experiencing uterine cramps is a good sign to stop exercising. Continue to eat regular, healthy meals. Wear a good support bra for breast tenderness. Do not use hot tubs, steam rooms, or saunas. Wear your seat belt at all times when driving. Avoid raw meat, uncooked cheese, cat litter boxes, and soil used by cats. These carry germs that can cause birth defects in the baby. Take your prenatal vitamins. Try taking a stool softener (if your health care provider approves) if you develop constipation. Eat more high-fiber foods, such as fresh vegetables or fruit and whole grains. Drink plenty of fluids to keep your urine clear or pale yellow. Take warm sitz baths to soothe any pain or discomfort caused by hemorrhoids. Use hemorrhoid cream if your health care provider approves. If you develop varicose veins, wear support hose. Elevate your feet for 15 minutes, 3-4 times a day. Limit salt in your diet. Avoid heavy lifting, wear low heel shoes, and practice good posture. Rest with your legs elevated if you have leg cramps or low back pain. Visit your dentist if you have not gone yet during your pregnancy. Use a soft toothbrush to brush your teeth and be gentle when you floss. A  sexual relationship may be continued unless your health care provider directs you otherwise. Continue to go to all your prenatal visits as directed by your health care provider. SEEK MEDICAL CARE IF:  You have dizziness. You have mild pelvic cramps, pelvic pressure, or nagging pain in the abdominal area. You have persistent nausea, vomiting, or diarrhea. You have a bad smelling vaginal discharge. You have pain with urination. SEEK IMMEDIATE MEDICAL CARE IF:  You have a fever. You are leaking fluid from your vagina. You have spotting or bleeding from your vagina. You have severe abdominal cramping or pain. You have rapid weight gain or loss. You have shortness of breath with chest pain. You  notice sudden or extreme swelling of your face, hands, ankles, feet, or legs. You have not felt your baby move in over an hour. You have severe headaches that do not go away with medicine. You have vision changes. Document Released: 09/27/2001 Document Revised: 10/08/2013 Document Reviewed: 12/04/2012 [facility] Patient Information 2015 [place], [place]. This information is not intended to replace advice given to you by your health care provider. Make sure you discuss any questions you have with your health care provider.

## 2024-10-14 ENCOUNTER — Other Ambulatory Visit: Payer: Self-pay | Admitting: Women's Health

## 2024-10-14 DIAGNOSIS — O0992 Supervision of high risk pregnancy, unspecified, second trimester: Secondary | ICD-10-CM

## 2024-10-14 DIAGNOSIS — O10919 Unspecified pre-existing hypertension complicating pregnancy, unspecified trimester: Secondary | ICD-10-CM

## 2024-10-14 DIAGNOSIS — O09529 Supervision of elderly multigravida, unspecified trimester: Secondary | ICD-10-CM

## 2024-10-14 DIAGNOSIS — Z3A26 26 weeks gestation of pregnancy: Secondary | ICD-10-CM

## 2024-10-14 DIAGNOSIS — O0990 Supervision of high risk pregnancy, unspecified, unspecified trimester: Secondary | ICD-10-CM

## 2024-10-14 DIAGNOSIS — F1120 Opioid dependence, uncomplicated: Secondary | ICD-10-CM

## 2024-10-16 ENCOUNTER — Other Ambulatory Visit: Payer: MEDICAID

## 2024-10-16 ENCOUNTER — Other Ambulatory Visit: Payer: MEDICAID | Admitting: Radiology

## 2024-10-16 ENCOUNTER — Encounter: Payer: MEDICAID | Admitting: Obstetrics & Gynecology

## 2024-10-21 DIAGNOSIS — F1120 Opioid dependence, uncomplicated: Secondary | ICD-10-CM

## 2024-10-21 DIAGNOSIS — O0992 Supervision of high risk pregnancy, unspecified, second trimester: Secondary | ICD-10-CM

## 2024-10-21 DIAGNOSIS — O0990 Supervision of high risk pregnancy, unspecified, unspecified trimester: Secondary | ICD-10-CM

## 2024-10-21 DIAGNOSIS — Z3A27 27 weeks gestation of pregnancy: Secondary | ICD-10-CM

## 2024-10-21 DIAGNOSIS — O10919 Unspecified pre-existing hypertension complicating pregnancy, unspecified trimester: Secondary | ICD-10-CM

## 2024-10-21 DIAGNOSIS — O09529 Supervision of elderly multigravida, unspecified trimester: Secondary | ICD-10-CM

## 2024-10-22 ENCOUNTER — Encounter: Payer: Self-pay | Admitting: Obstetrics & Gynecology

## 2024-10-22 ENCOUNTER — Other Ambulatory Visit: Payer: MEDICAID

## 2024-10-22 ENCOUNTER — Ambulatory Visit (INDEPENDENT_AMBULATORY_CARE_PROVIDER_SITE_OTHER): Payer: MEDICAID | Admitting: Obstetrics & Gynecology

## 2024-10-22 VITALS — BP 118/70 | HR 75 | Wt 221.0 lb

## 2024-10-22 DIAGNOSIS — F1120 Opioid dependence, uncomplicated: Secondary | ICD-10-CM

## 2024-10-22 DIAGNOSIS — F1190 Opioid use, unspecified, uncomplicated: Secondary | ICD-10-CM | POA: Diagnosis not present

## 2024-10-22 DIAGNOSIS — O0990 Supervision of high risk pregnancy, unspecified, unspecified trimester: Secondary | ICD-10-CM

## 2024-10-22 DIAGNOSIS — O0992 Supervision of high risk pregnancy, unspecified, second trimester: Secondary | ICD-10-CM

## 2024-10-22 DIAGNOSIS — O09529 Supervision of elderly multigravida, unspecified trimester: Secondary | ICD-10-CM

## 2024-10-22 DIAGNOSIS — O10919 Unspecified pre-existing hypertension complicating pregnancy, unspecified trimester: Secondary | ICD-10-CM

## 2024-10-22 DIAGNOSIS — Z3A27 27 weeks gestation of pregnancy: Secondary | ICD-10-CM

## 2024-10-22 DIAGNOSIS — O10912 Unspecified pre-existing hypertension complicating pregnancy, second trimester: Secondary | ICD-10-CM | POA: Diagnosis not present

## 2024-10-22 DIAGNOSIS — O09892 Supervision of other high risk pregnancies, second trimester: Secondary | ICD-10-CM | POA: Diagnosis not present

## 2024-10-22 DIAGNOSIS — O09522 Supervision of elderly multigravida, second trimester: Secondary | ICD-10-CM

## 2024-10-22 DIAGNOSIS — O99322 Drug use complicating pregnancy, second trimester: Secondary | ICD-10-CM | POA: Diagnosis not present

## 2024-10-22 NOTE — Progress Notes (Unsigned)
 "   HIGH-RISK PREGNANCY VISIT Patient name: Brandy Farrell MRN [id number]  Date of birth: [birth year] Chief Complaint:   Routine Prenatal Visit  History of Present Illness:   Brandy Farrell is a 34 y.o. G5P2003 female at [redacted]w[redacted]d with an Estimated Date of Delivery: 01/18/25 being seen today for ongoing management of a high-risk pregnancy complicated by     ICD-10-CM   1. Supervision of high risk pregnancy, antepartum  O09.90     2. Chronic hypertension affecting pregnancy: no meds needed at present  O10.919     3. Opioid use disorder: well managed on suboxone 8 mg TID  F11.90      .    Today she reports no complaints. Contractions: Not present. Vag. Bleeding: None.  Movement: Present. denies leaking of fluid.      08/21/2024   11:05 AM 05/07/2024    3:53 PM 06/03/2022   10:20 AM 06/03/2022   10:08 AM 09/14/2021    9:14 AM  Depression screen PHQ 2/9  Decreased Interest 0 0 1 1 0  Down, Depressed, Hopeless 0 0 0 0 0  PHQ - 2 Score 0 0 1 1 0  Altered sleeping 0 0 2 2 0  Tired, decreased energy 0 0 2 2 0  Change in appetite 0 0 0 0 0  Feeling bad or failure about yourself  0 0 0 0 0  Trouble concentrating 0 0 0 0 0  Moving slowly or fidgety/restless 0 0 0 0 0  Suicidal thoughts 0 0 0 0 0  PHQ-9 Score 0  0  5  5  0      Data saved with a previous flowsheet row definition        08/21/2024   11:05 AM 05/07/2024    3:53 PM 06/03/2022   10:24 AM 06/03/2022   10:08 AM  GAD 7 : Generalized Anxiety Score  Nervous, Anxious, on Edge 0 0 0 0  Control/stop worrying 0 0 0 0  Worry too much - different things 0 0 0 0  Trouble relaxing 0 0 0 0  Restless 0 0 0 0  Easily annoyed or irritable 0 0 0 0  Afraid - awful might happen 0 0 0 0  Total GAD 7 Score 0 0 0 0     Review of Systems:   Pertinent items are noted in HPI Denies abnormal vaginal discharge w/ itching/odor/irritation, headaches, visual changes, shortness of breath, chest pain, abdominal pain, severe nausea/vomiting,  or problems with urination or bowel movements unless otherwise stated above. Pertinent History Reviewed:  Reviewed past medical,surgical, social, obstetrical and family history.  Reviewed problem list, medications and allergies. Physical Assessment:   Vitals:   10/22/24 1141  BP: 118/70  Pulse: 75  Weight: 221 lb (100.2 kg)  Body mass index is 34.61 kg/m.           Physical Examination:   General appearance: alert, well appearing, and in no distress  Mental status: alert, oriented to person, place, and time  Skin: warm & dry   Extremities:      Cardiovascular: normal heart rate noted  Respiratory: normal respiratory effort, no distress  Abdomen: gravid, soft, non-tender  Pelvic: Cervical exam deferred         Fetal Status:     Movement: Present    Fetal Surveillance Testing today: EFW 81%   Chaperone: N/A    No results found for this or any previous visit (from the past 24  hours).  Assessment & Plan:  High-risk pregnancy: G3P2003 at [redacted]w[redacted]d with an Estimated Date of Delivery: 01/18/25      ICD-10-CM   1. Supervision of high risk pregnancy, antepartum  O09.90     2. Chronic hypertension affecting pregnancy: no meds needed at present  O10.919     3. Opioid use disorder: well managed on suboxone 8 mg TID  F11.90          Meds: No orders of the defined types were placed in this encounter.   Orders: No orders of the defined types were placed in this encounter.    Labs/procedures today: U/S  Treatment Plan:  4 weeks sonogram for EFW    Follow-up: Return in about 3 weeks (around 11/12/2024) for reschedule PN2.   Future Appointments  Date Time Provider Department Center  11/14/2024  3:00 PM [facility] US  [facility] None  11/14/2024  3:50 PM Marilynn Nest, DO [facility]    No orders of the defined types were placed in this encounter.  Vonn VEAR Inch  Attending Physician for the Center for [facility] 10/23/2024 5:54 PM  "

## 2024-10-22 NOTE — Progress Notes (Signed)
 US  27+3 wks,breech,anterior placenta with posterior accessory lobe,FHR 130 bpm,AFI 14 cm,EFW 1253 g 81%

## 2024-10-23 ENCOUNTER — Other Ambulatory Visit: Payer: MEDICAID

## 2024-11-14 ENCOUNTER — Encounter: Payer: MEDICAID | Admitting: Obstetrics & Gynecology

## 2024-11-14 ENCOUNTER — Other Ambulatory Visit: Payer: MEDICAID | Admitting: Radiology

## 2024-11-19 ENCOUNTER — Other Ambulatory Visit: Payer: Self-pay | Admitting: Women's Health

## 2024-11-19 DIAGNOSIS — O0990 Supervision of high risk pregnancy, unspecified, unspecified trimester: Secondary | ICD-10-CM

## 2024-11-19 DIAGNOSIS — O10919 Unspecified pre-existing hypertension complicating pregnancy, unspecified trimester: Secondary | ICD-10-CM

## 2024-11-19 DIAGNOSIS — Z3A32 32 weeks gestation of pregnancy: Secondary | ICD-10-CM

## 2024-11-19 DIAGNOSIS — F1120 Opioid dependence, uncomplicated: Secondary | ICD-10-CM

## 2024-11-19 DIAGNOSIS — O09529 Supervision of elderly multigravida, unspecified trimester: Secondary | ICD-10-CM

## 2024-11-25 ENCOUNTER — Other Ambulatory Visit: Payer: MEDICAID | Admitting: Radiology

## 2024-11-25 ENCOUNTER — Encounter: Payer: MEDICAID | Admitting: Women's Health
